# Patient Record
Sex: Female | Born: 1935 | Race: White | Hispanic: No | Smoking: Never smoker
Health system: Southern US, Community
[De-identification: ages and names within clinical notes are randomized; demographics above are authoritative.]

## PROBLEM LIST (undated history)

## (undated) DIAGNOSIS — C801 Malignant (primary) neoplasm, unspecified: Secondary | ICD-10-CM

## (undated) DIAGNOSIS — E785 Hyperlipidemia, unspecified: Secondary | ICD-10-CM

## (undated) DIAGNOSIS — K649 Unspecified hemorrhoids: Secondary | ICD-10-CM

## (undated) DIAGNOSIS — K219 Gastro-esophageal reflux disease without esophagitis: Secondary | ICD-10-CM

## (undated) DIAGNOSIS — K8689 Other specified diseases of pancreas: Secondary | ICD-10-CM

## (undated) HISTORY — DX: Hyperlipidemia, unspecified: E78.5

## (undated) HISTORY — DX: Gastro-esophageal reflux disease without esophagitis: K21.9

## (undated) HISTORY — PX: PORTACATH PLACEMENT: SHX2246

## (undated) HISTORY — DX: Unspecified hemorrhoids: K64.9

---

## 1988-11-25 HISTORY — PX: ABDOMINAL HYSTERECTOMY: SHX81

## 2000-10-23 ENCOUNTER — Ambulatory Visit (HOSPITAL_COMMUNITY): Admission: RE | Admit: 2000-10-23 | Discharge: 2000-10-23 | Payer: Self-pay | Admitting: Orthopedic Surgery

## 2000-10-23 ENCOUNTER — Encounter: Payer: Self-pay | Admitting: Orthopedic Surgery

## 2001-05-12 ENCOUNTER — Encounter (INDEPENDENT_AMBULATORY_CARE_PROVIDER_SITE_OTHER): Payer: Self-pay | Admitting: *Deleted

## 2001-05-12 ENCOUNTER — Ambulatory Visit (HOSPITAL_BASED_OUTPATIENT_CLINIC_OR_DEPARTMENT_OTHER): Admission: RE | Admit: 2001-05-12 | Discharge: 2001-05-12 | Payer: Self-pay | Admitting: Orthopedic Surgery

## 2004-11-25 HISTORY — PX: COLONOSCOPY: SHX5424

## 2005-03-26 ENCOUNTER — Ambulatory Visit: Payer: Self-pay | Admitting: Gastroenterology

## 2005-04-08 ENCOUNTER — Ambulatory Visit: Payer: Self-pay | Admitting: Gastroenterology

## 2007-04-08 ENCOUNTER — Encounter: Admission: RE | Admit: 2007-04-08 | Discharge: 2007-04-24 | Payer: Self-pay | Admitting: Sports Medicine

## 2011-04-12 NOTE — Op Note (Signed)
[facility]  Patient:    Crystal Small, Crystal Small                          MRN: [id number] Proc. Date: 05/12/01 Adm. Date:  [id number] Attending:  Ronne Binning                           Operative Report  PREOPERATIVE DIAGNOSES:  Arthritis left elbow, carpal tunnel syndrome left hand.  POSTOPERATIVE DIAGNOSES:  Arthritis left elbow, carpal tunnel syndrome left hand.  OPERATION:  Arthroscopic inspection, debridement, removal of loose bodies left elbow.  Left tunnel release.  SURGEON:  Nicki Reaper, M.D.  ASSISTANT:  RN  ANESTHESIA:  General.  ANESTHESIOLOGIST:  Guadalupe Maple, M.D.  HISTORY:  The patient is a 76 year old female with a history of arthritis, carpal tunnel syndrome both unresponsive to conservative treatment.  PROCEDURE:  The patient is brought to the operating room where a general anesthetic was carried out without difficulty.  She was prepped and draped using Betadine scrub and a solution in a lateral decubitus position, left arm up.  The prep was done with ______.  The arthroscopy was performed first. The joint plated to the posterior portal.  The medial portal was made after isolation of the ulnar nerve and the incision was made approximately 2.5-3 cm proximal to the medial epicondyle.  The medial intermuscular septum identified to the anterior aspect.  Blunt trocar was used to enter the joint after inflation.  The joint was inspected.  Significant arthritic changes were present about the entire joint both medial and lateral compartments.  A large, loose body was identified, large exostosis in the ______ coronoid fossa was also identified.  The lateral portal was then opened using switching stick by placement of the scope between the radial head and distal humerus.  This was then passed from an inside-out technique.  Incision made.  A second cannula was then entered from the lateral aspect.  A debridement was then  performed using ______ shaver and burrs.  The exostosis ______ was removed anteriorly.  The loose body was morcellized and removed from both medial and lateral aspects.  Scope was then introduced medially or laterally as necessary using switching stick.  A posterior aspect was then inspected.  No loose bodies were present posteriorly.  There were significant degenerative changes present on the radial head with entire eburnation of bone, radial head, and posterior capitellum and decided not to proceed with a radial head resection at this point in time.  The portals were then closed after removal of the scope and irrigation.  The patient reprepped and draped in a supine position.  A tourniquet placed high in the arm was inflated to 250 mmHG.  The straight incision was made longitudinally for carpal tunnel release, carried down through subcutaneous tissue.  Bleeders were electrocauterized.  Palmar fascia was split.  Superficial palmar arch identified.  The flexor tendon to the ring and little finger identified, to the ulnar side of the median nerve.  The carpal retinaculum was incised with sharp dissection.  A right angle Sewell retractor placed between skin and forearm fascia.  The fascia was released with approximately 3 cm proximal to the wrist crease under direct vision. Canal was explored.  No further lesions were identified.  The wound was irrigated.  The skin was closed with interrupted 5-0 nylon sutures.  Sterile compressive dressing and splint  was applied to the hand.  Compressive dressing applied to the elbow.  Patient tolerated the procedure well and was taken to the recovery room for observation in satisfactory condition.  She is discharged home to return to the [facility] in one week on Vicodin and Keflex. DD:  05/12/01 TD:  05/12/01 Job: [id number]

## 2015-05-11 ENCOUNTER — Encounter: Payer: Self-pay | Admitting: Internal Medicine

## 2015-06-30 ENCOUNTER — Encounter: Payer: Self-pay | Admitting: *Deleted

## 2015-07-18 ENCOUNTER — Encounter: Payer: Self-pay | Admitting: Internal Medicine

## 2015-07-18 ENCOUNTER — Ambulatory Visit (INDEPENDENT_AMBULATORY_CARE_PROVIDER_SITE_OTHER): Payer: Medicare Other | Admitting: Internal Medicine

## 2015-07-18 VITALS — BP 120/60 | HR 72 | Ht 64.57 in | Wt 128.4 lb

## 2015-07-18 DIAGNOSIS — K219 Gastro-esophageal reflux disease without esophagitis: Secondary | ICD-10-CM | POA: Diagnosis not present

## 2015-07-18 DIAGNOSIS — Z1211 Encounter for screening for malignant neoplasm of colon: Secondary | ICD-10-CM

## 2015-07-18 NOTE — Progress Notes (Signed)
Patient ID: Crystal Small, female   DOB: [birth year], 80 y.o.   MRN: [id number] HPI: Crystal Small is a 80 year old female with past medical history of GERD, hyperlipidemia and arthritis who seen to consider repeat screening colonoscopy. She is seen by Dr. Osborne Casco at [facility].  She is here alone today. She reports she is feeling well. She has been having regular bowel movements though on occasion can have mild constipation. She adds flaxseeds to her cereal and trys to eat almonds every day for fiber. She denies abdominal pain. Occasionally she'll fill left-sided abdominal discomfort which comes and goes. Last less than a day and doesn't seem to relate to eating or bowel movement. Appetite has been good. She denies dysphagia or odynophagia. No nausea or vomiting. Denies blood in her stool or melena. She takes omeprazole 40 mg daily and uses over-the-counter Zantac very rarely for breakthrough heartburn at night. She tries to follow a GERD diet and avoids eating late at night and also tries to remain upright for an hour after eating. She denies prior history of colon polyps. Denies family history of colon cancer. She last had a colonoscopy on 04/08/2005 by Dr. Sharlett Iles. This was normal.  Past Medical History  Diagnosis Date  . Hyperlipidemia   . Hemorrhoids     Past Surgical History  Procedure Laterality Date  . Colonoscopy  2006  . Abdominal hysterectomy  1990    No outpatient prescriptions prior to visit.   No facility-administered medications prior to visit.    No Known Allergies  Family History  Problem Relation Age of Onset  . Heart disease Sister   . Heart disease Brother   . Lung cancer Brother     Social History  Substance Use Topics  . Smoking status: Never Smoker   . Smokeless tobacco: Never Used  . Alcohol Use: No    ROS: As per history of present illness, otherwise negative  BP 120/60 mmHg  Pulse 72  Ht 5' 4.57" (1.64 m)  Wt 128 lb 6 oz (58.231 kg)  BMI 21.65  kg/m2 Constitutional: Well-developed and well-nourished. No distress. HEENT: Normocephalic and atraumatic. Oropharynx is clear and moist. No oropharyngeal exudate. Conjunctivae are normal.  No scleral icterus. Neck: Neck supple. Trachea midline. Cardiovascular: Normal rate, regular rhythm and intact distal pulses. No M/R/G Pulmonary/chest: Effort normal and breath sounds normal. No wheezing, rales or rhonchi. Abdominal: Soft, nontender, nondistended. Bowel sounds active throughout. There are no masses palpable. No hepatosplenomegaly. Extremities: no clubbing, cyanosis, or edema Lymphadenopathy: No cervical adenopathy noted. Neurological: Alert and oriented to person place and time. Skin: Skin is warm and dry. No rashes noted. Psychiatric: Normal mood and affect. Behavior is normal.   ASSESSMENT/PLAN:  80 year old female with past medical history of GERD, hyperlipidemia and arthritis who seen to consider repeat screening colonoscopy.   1. CRC screening -- average risk, no prior history of polyps. We discussed colonoscopy including the risks, benefits and alternatives. We also discussed Cologuard. She would prefer Cologuard and understands that if positive she will need colonoscopy and she would be agreeable if necessary. This test will be ordered today. Given her age, if Cologuard negative she will likely not require further colon cancer screening  2. GERD -- stable without alarm symptoms. Continue omeprazole 40 mg daily and when necessary ranitidine for breakthrough.    ZC:[address], [address] [place], [place] [address]

## 2015-07-18 NOTE — Patient Instructions (Signed)
We have sent your demographic and insurance information to [facility]. They should contact you within the next week regarding your Cologuard (colon cancer screening) test. If you have not heard from them within the next week, please call our office at [phone]. 

## 2015-07-24 LAB — COLOGUARD: Cologuard: NEGATIVE

## 2015-08-04 ENCOUNTER — Telehealth: Payer: Self-pay | Admitting: *Deleted

## 2015-08-04 NOTE — Telephone Encounter (Signed)
Dr Hilarie Fredrickson has received patient's cologuard results and indicates the following : "great news. Please inform patient. Will NOT require repeat screening given age of 30 years." I have attempted to reach the patient to advise her but I have been unable to reach her as her phone was busy x 2. I will attempt to call her back at a later time.

## 2015-08-07 NOTE — Telephone Encounter (Signed)
I have spoken to patient to advise of Dr Vena Rua interpretation of cologuard testing. Patient verbalizes understanding.

## 2015-08-10 ENCOUNTER — Encounter: Payer: Self-pay | Admitting: Internal Medicine

## 2016-05-17 ENCOUNTER — Encounter: Payer: Self-pay | Admitting: *Deleted

## 2016-06-10 ENCOUNTER — Ambulatory Visit (INDEPENDENT_AMBULATORY_CARE_PROVIDER_SITE_OTHER): Payer: Medicare Other | Admitting: Internal Medicine

## 2016-06-10 ENCOUNTER — Encounter: Payer: Self-pay | Admitting: Internal Medicine

## 2016-06-10 VITALS — BP 112/70 | HR 64 | Ht 64.0 in | Wt 114.6 lb

## 2016-06-10 DIAGNOSIS — R109 Unspecified abdominal pain: Secondary | ICD-10-CM | POA: Diagnosis not present

## 2016-06-10 DIAGNOSIS — R634 Abnormal weight loss: Secondary | ICD-10-CM

## 2016-06-10 MED ORDER — HYOSCYAMINE SULFATE 0.125 MG SL SUBL: SUBLINGUAL_TABLET | SUBLINGUAL | Status: DC

## 2016-06-10 NOTE — Progress Notes (Signed)
Subjective:    Patient ID: Crystal Small, female    DOB: 21-Nov-1935, 81 y.o.   MRN: [id number]  HPI Crystal Small is a 81 year old female with a past medical history of GERD, hyperlipidemia and arthritis who is here to discuss new onset left lower quadrant abdominal pain and weight loss. She was seen here in August 2016 to discuss screening/surveillance colonoscopy. The decision was made to pursue Cologuard testing which was performed and negative. She is here today with her daughter  She reports over the last several months she's developed a soreness and at times pain in her left lower quadrant. This feels like a swelling and can at times become severe. It seems to be worse with eating though she is unaware of specific dietary trigger. She has been avoiding lactose and this is not changed the pain but has improved her reflux symptoms. Pain at times seems to be worse with lying down. She reports getting up and walking around to try to help the pain. Because of the associated worsening with eating she has avoided eating full meals. At times she feels full quickly. She denies nausea and vomiting. She has lost 14 pounds since her last visit here 11 months ago. She feels like she has lost 8 pounds from December 16 of June 17 and 4 pounds June. She reports her bowel movements is regular at times they are small and other times they're larger and more normal size. She states they are formed without blood or melena. Denies constipation and diarrhea. For her reflux she's been using omeprazole 40 mg daily and very rarely over-the-counter Zantac.  She had labs performed very recently by primary care which she has a copy of have reviewed. CMP within normal limits. Total bilirubin 0.7, AST 19, ALT 15, albumin 3.8. CBC: WBC 7.0, hemoglobin 13.3, MCV 100.3, platelet 211 Vitamin D normal  Review of Systems As per history of present illness, otherwise negative  Current Medications, Allergies, Past Medical History,  Past Surgical History, Family History and Social History were reviewed in [employer] record.     Objective:   Physical Exam BP 112/70 mmHg  Pulse 64  Ht 5\' 4"  (1.626 m)  Wt 114 lb 9.6 oz (51.982 kg)  BMI 19.66 kg/m2 Constitutional: Well-developed and well-nourished. No distress. HEENT: Normocephalic and atraumatic. Oropharynx is clear and moist. No oropharyngeal exudate. Conjunctivae are normal.  No scleral icterus. Neck: Neck supple. Trachea midline. Cardiovascular: Normal rate, regular rhythm and intact distal pulses. No M/R/G Pulmonary/chest: Effort normal and breath sounds normal. No wheezing, rales or rhonchi. Abdominal: Soft, Moderate tenderness in the left mid and lower quadrant and suprapubic abdomen without rebound or guarding, nondistended. Bowel sounds active throughout. No HSM Extremities: no clubbing, cyanosis, or edema Lymphadenopathy: No cervical adenopathy noted. Neurological: Alert and oriented to person place and time. Skin: Skin is warm and dry. No rashes noted. Psychiatric: Normal mood and affect. Behavior is normal.  Last colonoscopy 04/08/2005 -- normal. Preparation excellent.     Assessment & Plan:  81 year old female with a past medical history of GERD, hyperlipidemia and arthritis who is here to discuss new onset left lower quadrant abdominal pain and weight loss.   1. LLQ pain/weight loss -- Her left lower quadrant pain and weight loss is concerning though she does not seem to have any change in bowel habit. Her labs performed on 05/14/2016 were reviewed and normal, which is reassuring. Cologuard recently negative, also reassuring. I recommended CT scan of the abdomen  and pelvis with IV contrast to further evaluate this pain, rule out diverticulitis and colitis. If negative consider upper endoscopy and colonoscopy versus flexible sigmoidoscopy. If GI evaluation unremarkable would recommend evaluating the thoracic and lumbar spine which  could cause referred pain given her history of arthritis. Will give Levsin 0.125 mg sublingual every 4-6 hours for abdominal pain while awaiting workup.  25 minutes spent with the patient today. Greater than 50% was spent in counseling and coordination of care with the patient

## 2016-06-10 NOTE — Patient Instructions (Signed)
We have sent the following medications to your pharmacy for you to pick up at your convenience:Levsin.  You have been scheduled for a CT scan of the abdomen and pelvis at [facility] ([address]---this is in the same building as [facility]).   You are scheduled on 06/14/16 at 10:30am. You should arrive 15 minutes prior to your appointment time for registration. Please follow the written instructions below on the day of your exam:  WARNING: IF YOU ARE ALLERGIC TO IODINE/X-RAY DYE, PLEASE NOTIFY RADIOLOGY IMMEDIATELY AT [phone]! YOU WILL BE GIVEN A 13 HOUR PREMEDICATION PREP.  1) Do not eat or drink anything after 6:30am (4 hours prior to your test) 2) You have been given 2 bottles of oral contrast to drink. The solution may taste better if refrigerated, but do NOT add ice or any other liquid to this solution. Shake well before drinking.    Drink 1 bottle of contrast @ 8:30am (2 hours prior to your exam)  Drink 1 bottle of contrast @ 9:30am (1 hour prior to your exam)  You may take any medications as prescribed with a small amount of water except for the following: Metformin, Glucophage, Glucovance, Avandamet, Riomet, Fortamet, Actoplus Met, Janumet, Glumetza or Metaglip. The above medications must be held the day of the exam AND 48 hours after the exam.  The purpose of you drinking the oral contrast is to aid in the visualization of your intestinal tract. The contrast solution may cause some diarrhea. Before your exam is started, you will be given a small amount of fluid to drink. Depending on your individual set of symptoms, you may also receive an intravenous injection of x-ray contrast/dye. Plan on being at [facility] for 30 minutes or longer, depending on the type of exam you are having performed.  This test typically takes 30-45 minutes to complete.  If you have any questions regarding your exam or if you need to reschedule, you may call the CT department  at [phone] between the hours of 8:00 am and 5:00 pm, Monday-Friday.  ________________________________________________________________________

## 2016-06-14 ENCOUNTER — Telehealth: Payer: Self-pay | Admitting: *Deleted

## 2016-06-14 ENCOUNTER — Ambulatory Visit (INDEPENDENT_AMBULATORY_CARE_PROVIDER_SITE_OTHER)
Admission: RE | Admit: 2016-06-14 | Discharge: 2016-06-14 | Disposition: A | Discharge status: Home / Self Care | Payer: Medicare Other | Source: Ambulatory Visit | Attending: Internal Medicine | Admitting: Internal Medicine

## 2016-06-14 DIAGNOSIS — R109 Unspecified abdominal pain: Secondary | ICD-10-CM

## 2016-06-14 DIAGNOSIS — R634 Abnormal weight loss: Secondary | ICD-10-CM | POA: Diagnosis not present

## 2016-06-14 MED ORDER — IOPAMIDOL (ISOVUE-300) INJECTION 61%
100.0000 mL | Freq: Once | INTRAVENOUS | Status: AC | PRN
  Administered 2016-06-14: 80 mL via INTRAVENOUS

## 2016-06-14 NOTE — Telephone Encounter (Signed)
Call report received from [place] in regards to CT. 5.7 cm mass in the pancreatic body and tail, highly suspicious for pancreatic adenocarcinoma. 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 No evidence of involvement of the celiac axis or SMA.  I have spoken to Dr Hilarie Fredrickson via phone to advise of results. He is comfortable with waiting until Monday to go over results with patient since not much can be done over the weekend. Splenic vein thrombosis unlikely to be treated with anticoagulation in the setting of probable adenocarcinoma. Dr Hilarie Fredrickson has requested that Dr Ardis Hughs look over case for possible EUS and note will be routed to him. Dr Havery Moros is also advised of results (doc of day) in the case that patient calls today for results.

## 2016-06-14 NOTE — Telephone Encounter (Signed)
All, I looked at CT.  Pretty concerning for metastatic pancreatic cancer, tail of pancreas mass and multiple suspicious masses in the liver.  I recommend US guided biopsy of one of the liver lesions, IR should be able to get one of the peripheral lesions pretty easily. That will hopefully prove the diagnosis and up-stage appropriately.  Would begin referral process to medical oncology as well.

## 2016-06-17 ENCOUNTER — Telehealth: Payer: Self-pay | Admitting: *Deleted

## 2016-06-17 ENCOUNTER — Telehealth: Payer: Self-pay | Admitting: Internal Medicine

## 2016-06-17 DIAGNOSIS — R935 Abnormal findings on diagnostic imaging of other abdominal regions, including retroperitoneum: Secondary | ICD-10-CM

## 2016-06-17 MED ORDER — HYDROCODONE-ACETAMINOPHEN 5-325 MG PO TABS: ORAL_TABLET | ORAL | 0 refills | Status: DC

## 2016-06-17 NOTE — Telephone Encounter (Signed)
See result note Pt and daughter notified Arranging IR guided bx See result note

## 2016-06-17 NOTE — Addendum Note (Signed)
Addended by: Larina Bras on: 06/17/2016 03:08 PM   Modules accepted: Orders

## 2016-06-17 NOTE — Telephone Encounter (Signed)
error 

## 2016-06-17 NOTE — Telephone Encounter (Signed)
Liver biopsy has been ordered STAT and is awaiting review by radiologist prior to scheduling. Dr Hilarie Fredrickson has also okayed patient to have hydrocodone 5/325 mg-1 tablet by mouth every 6-8 hours as needed for abdominal pain #60. Med Oncology referral placed and Merceda Elks, oncology navigator was sent staff message as well.

## 2016-06-17 NOTE — Telephone Encounter (Signed)
-----   Message from Jerene Bears, MD sent at 06/17/2016 10:25 AM EDT ----- I spoke with the patient and her daughter, Suanne Marker, today by phone to discuss CT abdomen and pelvis results Pancreatic tumor concerning for malignancy with spread to the liver We discussed this at length. Dr. Ardis Hughs has reviewed, both he and I agree that IR guided liver biopsy is a most appropriate next step. Please arrange IR guided liver biopsy of liver lesions, rule out pancreatic adenocarcinoma. This should be ordered ASAP Oncology referral can be arranged with Dr. Benay Spice or Dr. Burr Medico Time provided for questions and answers. They both thanked me for the call and will call with questions or concerns going forward. I will cc her primary care provider

## 2016-06-18 ENCOUNTER — Other Ambulatory Visit: Payer: Self-pay | Admitting: General Surgery

## 2016-06-18 ENCOUNTER — Other Ambulatory Visit: Payer: Self-pay | Admitting: Physician Assistant

## 2016-06-19 ENCOUNTER — Encounter (HOSPITAL_COMMUNITY): Payer: Self-pay

## 2016-06-19 ENCOUNTER — Other Ambulatory Visit: Payer: Self-pay | Admitting: Internal Medicine

## 2016-06-19 ENCOUNTER — Ambulatory Visit (HOSPITAL_COMMUNITY)
Admission: RE | Admit: 2016-06-19 | Discharge: 2016-06-19 | Disposition: A | Discharge status: Home / Self Care | Payer: Medicare Other | Source: Ambulatory Visit | Attending: Internal Medicine | Admitting: Internal Medicine

## 2016-06-19 ENCOUNTER — Other Ambulatory Visit: Payer: Self-pay | Admitting: General Surgery

## 2016-06-19 DIAGNOSIS — R935 Abnormal findings on diagnostic imaging of other abdominal regions, including retroperitoneum: Secondary | ICD-10-CM

## 2016-06-19 HISTORY — PX: LIVER BIOPSY: SHX301

## 2016-06-19 LAB — CBC
HCT: 34.7 % — ABNORMAL LOW (ref 36.0–46.0)
HEMOGLOBIN: 11.2 g/dL — AB (ref 12.0–15.0)
MCH: 30.2 pg (ref 26.0–34.0)
MCHC: 32.3 g/dL (ref 30.0–36.0)
MCV: 93.5 fL (ref 78.0–100.0)
PLATELETS: 222 10*3/uL (ref 150–400)
RBC: 3.71 MIL/uL — ABNORMAL LOW (ref 3.87–5.11)
RDW: 12.8 % (ref 11.5–15.5)
WBC: 9.2 10*3/uL (ref 4.0–10.5)

## 2016-06-19 LAB — PROTIME-INR
INR: 1.09
PROTHROMBIN TIME: 14.1 s (ref 11.4–15.2)

## 2016-06-19 LAB — APTT: aPTT: 32 seconds (ref 24–36)

## 2016-06-19 MED ORDER — FENTANYL CITRATE (PF) 100 MCG/2ML IJ SOLN
INTRAMUSCULAR | Status: AC
  Filled 2016-06-19: qty 2

## 2016-06-19 MED ORDER — SODIUM CHLORIDE 0.9 % IV SOLN: INTRAVENOUS | Status: DC

## 2016-06-19 MED ORDER — GELATIN ABSORBABLE 12-7 MM EX MISC
CUTANEOUS | Status: AC
  Filled 2016-06-19: qty 1

## 2016-06-19 MED ORDER — SODIUM CHLORIDE 0.9 % IV SOLN
INTRAVENOUS | Status: AC | PRN
  Administered 2016-06-19: 10 mL/h via INTRAVENOUS

## 2016-06-19 MED ORDER — MIDAZOLAM HCL 2 MG/2ML IJ SOLN
INTRAMUSCULAR | Status: AC | PRN
  Administered 2016-06-19: 1 mg via INTRAVENOUS

## 2016-06-19 MED ORDER — FENTANYL CITRATE (PF) 100 MCG/2ML IJ SOLN
INTRAMUSCULAR | Status: AC | PRN
  Administered 2016-06-19: 25 ug via INTRAVENOUS

## 2016-06-19 MED ORDER — MIDAZOLAM HCL 2 MG/2ML IJ SOLN
INTRAMUSCULAR | Status: AC
  Filled 2016-06-19: qty 2

## 2016-06-19 MED ORDER — LIDOCAINE HCL (PF) 1 % IJ SOLN
INTRAMUSCULAR | Status: AC
  Filled 2016-06-19: qty 10

## 2016-06-19 NOTE — H&P (Signed)
Chief Complaint: Abdominal pain  Referring Physician(s): Pyrtle,Jay M  Supervising Physician: Markus Daft  Patient Status: Outpatient  History of Present Illness:  Crystal Small is a 81 y.o. female who reports over the last several months she's developed a soreness and at times pain in her left lower quadrant.    She states it feels like a swelling and can at times become severe. It seems to be worse with eating though she is unaware of specific dietary trigger.   Pain at times seems to be worse with lying down. She reports getting up and walking around to try to help the pain.   Because of the associated worsening with eating she has avoided eating full meals. At times she feels full quickly.   She denies nausea and vomiting. She has lost 14 pounds since her last visit here 11 months ago.  She was seen by Dr. Hilarie Fredrickson for evaluation and CT scan was obtained.  CT revealed a 5.7 cm mass in the pancreatic body and tail, highly suspicious for pancreatic adenocarcinoma with multiple liver mets.  We are asked to perform an US guided biopsy of one of the liver lesions today.  She feels ok today, no recent illness, fever or chills.   She is NPO. She does not take blood thinners.  Past Medical History:  Diagnosis Date  . GERD (gastroesophageal reflux disease)   . Hemorrhoids   . Hyperlipidemia     Past Surgical History:  Procedure Laterality Date  . ABDOMINAL HYSTERECTOMY  1990  . COLONOSCOPY  2006    Allergies: Review of patient's allergies indicates no known allergies.  Medications: Prior to Admission medications   Medication Sig Start Date End Date Taking? Authorizing Provider  aspirin 325 MG tablet Take 325 mg by mouth every 6 (six) hours as needed for mild pain.   Yes Historical Provider, MD  cholecalciferol (VITAMIN D) 1000 UNITS tablet Take 1,000 Units by mouth 3 (three) times a week.   Yes Historical Provider, MD  HYDROcodone-acetaminophen (NORCO/VICODIN) 5-325  MG tablet Take 1 tablet by mouth every 6-8 hours as needed for abdominal pain 06/17/16  Yes Amy S Esterwood, PA-C  hyoscyamine (LEVSIN SL) 0.125 MG SL tablet Take 1-2 capsules by mouth every 4-6 hours as needed for abd pain 06/10/16  Yes Jerene Bears, MD  Multiple Vitamins-Minerals (MULTIVITAMIN ADULT PO) Take 1 tablet by mouth daily.    Yes Historical Provider, MD  omeprazole (PRILOSEC) 40 MG capsule Take 40 mg by mouth daily.  04/25/15  Yes Historical Provider, MD  pravastatin (PRAVACHOL) 20 MG tablet Take 40 mg by mouth daily.  05/25/15  Yes Historical Provider, MD     Family History  Problem Relation Age of Onset  . Heart disease Sister   . Heart disease Brother   . Lung cancer Brother     Social History   Social History  . Marital status: Widowed    Spouse name: N/A  . Number of children: 2  . Years of education: N/A   Occupational History  . Retired    Social History Main Topics  . Smoking status: Never Smoker  . Smokeless tobacco: Never Used  . Alcohol use No  . Drug use: No  . Sexual activity: Not Asked   Other Topics Concern  . None   Social History Narrative  . None     Review of Systems: A 12 point ROS discussed  Review of Systems  Constitutional: Positive for appetite change and  unexpected weight change. Negative for activity change, chills, fatigue and fever.  HENT: Negative.   Respiratory: Negative for cough and shortness of breath.   Cardiovascular: Negative for chest pain.  Gastrointestinal: Positive for abdominal pain. Negative for nausea and vomiting.       Early satiety   Genitourinary: Negative.   Musculoskeletal: Negative.   Skin: Negative.   Neurological: Negative.   Hematological: Negative.   Psychiatric/Behavioral: Negative.     Vital Signs: BP 134/62   Pulse 85   Temp 98.6 F (37 C)   Ht 5\' 6"  (1.676 m)   Wt 114 lb (51.7 kg)   SpO2 97%   BMI 18.40 kg/m   Physical Exam  Constitutional: She is oriented to person, place, and time.  She appears well-developed and well-nourished.  HENT:  Head: Normocephalic and atraumatic.  Eyes: EOM are normal.  Neck: Normal range of motion.  Cardiovascular: Normal rate, regular rhythm and normal heart sounds.   Pulmonary/Chest: Effort normal and breath sounds normal. She has no wheezes.  Abdominal: Soft. Bowel sounds are normal. She exhibits no distension. There is no tenderness.  Musculoskeletal: Normal range of motion.  Neurological: She is alert and oriented to person, place, and time.  Skin: Skin is warm and dry.  Psychiatric: She has a normal mood and affect. Her behavior is normal. Judgment and thought content normal.  Vitals reviewed.   Mallampati Score:  MD Evaluation Airway: WNL Heart: WNL Abdomen: WNL Chest/ Lungs: WNL ASA  Classification: 3 Mallampati/Airway Score: Two  Imaging: Ct Abdomen Pelvis W Contrast  Result Date: 06/14/2016 CLINICAL DATA:  Worsening left-sided abdominal pain and unintentional 12 pounds weight loss. EXAM: CT ABDOMEN AND PELVIS WITH CONTRAST TECHNIQUE: Multidetector CT imaging of the abdomen and pelvis was performed using the standard protocol following bolus administration of intravenous contrast. CONTRAST:  39mL ISOVUE-300 IOPAMIDOL (ISOVUE-300) INJECTION 61% COMPARISON:  None. FINDINGS: Lower chest:  No acute findings. Hepatobiliary: Several hypodense masses with peripheral enhancement are seen in both the right and left hepatic lobes, largest in segment 7 of the posterior right hepatic lobe measuring approximately 3.8 x 3.0 cm. These are consistent with hepatic metastases. Gallbladder is unremarkable. No evidence of biliary ductal dilatation. Pancreas: A poorly defined hypodense mass is seen in the pancreatic body and tail measuring 4.5 x 65.7 cm on image 17/series 2. This is suspicious for primary pancreatic adenocarcinoma. This mass abuts and narrows the superior mesenteric vein along its anterior and left lateral walls on images 18 and 19/2,  and also causes splenic vein thrombosis. There is no evidence of involvement of the celiac axis or SMA. Spleen: Within normal limits in size and appearance. Adrenals/Urinary Tract: No masses identified. No evidence of hydronephrosis. Stomach/Bowel: No evidence of obstruction, inflammatory process, or abnormal fluid collections. Vascular/Lymphatic: Small less than 1 cm peripancreatic lymph nodes are seen, largest in the gastrohepatic ligament measuring 8 mm on image 12/2. Metastatic disease cannot be excluded. Aortic atherosclerosis noted. No evidence of abdominal aortic aneurysm. Reproductive: Prior hysterectomy noted. Adnexal regions are unremarkable in appearance. Pelvic floor laxity with cystocele formation noted. Other: None. Musculoskeletal: No suspicious bone lesions identified. Advanced lumbar spine degenerative changes are noted with old compression fracture deformity of the T12 vertebral body. IMPRESSION: 5.7 cm mass in the pancreatic body and tail, highly suspicious for pancreatic adenocarcinoma. 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 No evidence of involvement of the celiac axis or SMA. Tiny less than 1 cm peripancreatic  lymph nodes. Lymph node metastases cannot be excluded. Multiple liver metastases. Aortic atherosclerosis noted. These results will be called to the ordering clinician or representative by the Radiologist Assistant, and communication documented in the PACS or zVision Dashboard. Electronically Signed   By: Earle Gell M.D.   On: 06/14/2016 13:48    Labs:  CBC: No results for input(s): WBC, HGB, HCT, PLT in the last 8760 hours.  COAGS: No results for input(s): INR, APTT in the last 8760 hours.  BMP: No results for input(s): NA, K, CL, CO2, GLUCOSE, BUN, CALCIUM, CREATININE, GFRNONAA, GFRAA in the last 8760 hours.  Invalid input(s): CMP  LIVER FUNCTION TESTS: No results for input(s): BILITOT, AST, ALT, ALKPHOS, PROT,  ALBUMIN in the last 8760 hours.  TUMOR MARKERS: No results for input(s): AFPTM, CEA, CA199, CHROMGRNA in the last 8760 hours.  Assessment and Plan:  5.7 cm mass in the pancreatic body and tail, highly suspicious for pancreatic adenocarcinoma with multiple liver mets.  Will proceed with image guided biopsy of a liver mass today by Dr. Anselm Pancoast.  Risks and Benefits discussed with the patient including, but not limited to bleeding, infection, damage to adjacent structures or low yield requiring additional tests.  All of the patient's questions were answered, patient is agreeable to proceed. Consent signed and in chart.  Thank you for this interesting consult.  I greatly enjoyed meeting Crystal Small and look forward to participating in their care.  A copy of this report was sent to the requesting provider on this date.  Electronically Signed: Murrell Redden PA-C 06/19/2016, 12:24 PM   I spent a total of  30 Minutes in face to face in clinical consultation, greater than 50% of which was counseling/coordinating care for biopsy of liver.

## 2016-06-19 NOTE — Progress Notes (Signed)
Received from radiology via stretcher.  A&O x 4.  Pt did not have biopsy due to difficult visualization of site.  Will monitor until discharge.

## 2016-06-19 NOTE — Procedures (Signed)
Planned for US guided liver lesion biopsy.  Thought we were able to visualize a lesion in right hepatic lobe with [place].  Patient given moderate sedation.  Patient was prepped and draped in sterile fashion.  Unable to confidentially identify the lesion after patient was sedated.  Therefore, biopsy was not performed.

## 2016-06-20 ENCOUNTER — Encounter (HOSPITAL_COMMUNITY): Payer: Self-pay | Admitting: *Deleted

## 2016-06-20 ENCOUNTER — Other Ambulatory Visit: Payer: Self-pay

## 2016-06-20 DIAGNOSIS — K8689 Other specified diseases of pancreas: Secondary | ICD-10-CM

## 2016-06-21 ENCOUNTER — Telehealth: Payer: Self-pay | Admitting: *Deleted

## 2016-06-21 ENCOUNTER — Encounter (HOSPITAL_COMMUNITY): Payer: Self-pay | Admitting: *Deleted

## 2016-06-21 NOTE — Telephone Encounter (Signed)
Oncology Nurse Navigator Documentation  Oncology Nurse Navigator Flowsheets 06/21/2016  Navigator Location [facility]  Navigator Encounter Type Telephone  Telephone Outgoing Call  Abnormal Finding Date 06/14/2016  Spoke with patient and provided new patient appointment for 07/02/16 at 2 pm to see Dr. Benay Spice. Informed of location of [facility], valet service, and registration process. Reminded to bring insurance cards and a current medication list, including supplements. Patient verbalizes understanding.

## 2016-06-25 ENCOUNTER — Ambulatory Visit: Payer: Medicare Other | Admitting: Oncology

## 2016-06-27 ENCOUNTER — Ambulatory Visit (HOSPITAL_COMMUNITY): Payer: Medicare Other | Admitting: Registered Nurse

## 2016-06-27 ENCOUNTER — Encounter (HOSPITAL_COMMUNITY): Payer: Self-pay | Admitting: Registered Nurse

## 2016-06-27 ENCOUNTER — Ambulatory Visit (HOSPITAL_COMMUNITY)
Admission: RE | Admit: 2016-06-27 | Discharge: 2016-06-27 | Disposition: A | Discharge status: Home / Self Care | Payer: Medicare Other | Source: Ambulatory Visit | Attending: Gastroenterology | Admitting: Gastroenterology

## 2016-06-27 ENCOUNTER — Encounter (HOSPITAL_COMMUNITY)
Admission: RE | Disposition: A | Discharge status: Home / Self Care | Payer: Self-pay | Source: Ambulatory Visit | Attending: Gastroenterology

## 2016-06-27 DIAGNOSIS — K219 Gastro-esophageal reflux disease without esophagitis: Secondary | ICD-10-CM | POA: Diagnosis not present

## 2016-06-27 DIAGNOSIS — R932 Abnormal findings on diagnostic imaging of liver and biliary tract: Secondary | ICD-10-CM | POA: Diagnosis not present

## 2016-06-27 DIAGNOSIS — C220 Liver cell carcinoma: Secondary | ICD-10-CM | POA: Insufficient documentation

## 2016-06-27 DIAGNOSIS — M1990 Unspecified osteoarthritis, unspecified site: Secondary | ICD-10-CM | POA: Diagnosis not present

## 2016-06-27 DIAGNOSIS — R1032 Left lower quadrant pain: Secondary | ICD-10-CM | POA: Diagnosis present

## 2016-06-27 DIAGNOSIS — R933 Abnormal findings on diagnostic imaging of other parts of digestive tract: Secondary | ICD-10-CM

## 2016-06-27 DIAGNOSIS — K8689 Other specified diseases of pancreas: Secondary | ICD-10-CM

## 2016-06-27 HISTORY — PX: EUS: SHX5427

## 2016-06-27 HISTORY — DX: Other specified diseases of pancreas: K86.89

## 2016-06-27 SURGERY — ESOPHAGEAL ENDOSCOPIC ULTRASOUND (EUS) RADIAL
Anesthesia: Monitor Anesthesia Care

## 2016-06-27 MED ORDER — LIDOCAINE HCL (CARDIAC) 20 MG/ML IV SOLN
INTRAVENOUS | Status: AC
  Filled 2016-06-27: qty 5

## 2016-06-27 MED ORDER — LIDOCAINE HCL (CARDIAC) 20 MG/ML IV SOLN
INTRAVENOUS | Status: DC | PRN
  Administered 2016-06-27: 50 mg via INTRAVENOUS

## 2016-06-27 MED ORDER — SODIUM CHLORIDE 0.9 % IV SOLN: INTRAVENOUS | Status: DC

## 2016-06-27 MED ORDER — PROPOFOL 10 MG/ML IV BOLUS
INTRAVENOUS | Status: AC
  Filled 2016-06-27: qty 20

## 2016-06-27 MED ORDER — GLYCOPYRROLATE 0.2 MG/ML IJ SOLN
INTRAMUSCULAR | Status: DC | PRN
  Administered 2016-06-27: .2 mg via INTRAVENOUS

## 2016-06-27 MED ORDER — PROPOFOL 500 MG/50ML IV EMUL
INTRAVENOUS | Status: DC | PRN
  Administered 2016-06-27: 250 ug/kg/min via INTRAVENOUS

## 2016-06-27 MED ORDER — GLYCOPYRROLATE 0.2 MG/ML IJ SOLN
INTRAMUSCULAR | Status: AC
  Filled 2016-06-27: qty 1

## 2016-06-27 MED ORDER — LACTATED RINGERS IV SOLN
INTRAVENOUS | Status: DC | PRN
Start: 2016-06-27 — End: 2016-06-27
  Administered 2016-06-27: 10:00:00 via INTRAVENOUS

## 2016-06-27 MED ORDER — ONDANSETRON HCL 4 MG/2ML IJ SOLN
INTRAMUSCULAR | Status: AC
  Filled 2016-06-27: qty 2

## 2016-06-27 NOTE — Discharge Instructions (Signed)

## 2016-06-27 NOTE — Op Note (Signed)
[facility] Patient Name: Crystal Small Procedure Date: 06/27/2016 MRN: [id number] Attending MD: Milus Banister , MD Date of Birth: [birth year] CSN: [id number] Age: 81 Admit Type: Outpatient Procedure:                Upper EUS Indications:              Suspected mass in liver on CT scan, Suspected mass                            in pancreas on CT scan Providers:                Milus Banister, MD, Zenon Mayo, RN, Alfonso Patten,                            Technician, Enrigue Catena, CRNA Referring MD:             Zenovia Jarred, MD Medicines:                Monitored Anesthesia Care Complications:            No immediate complications. Estimated blood loss:                            None. Estimated Blood Loss:     Estimated blood loss: none. Procedure:                Pre-Anesthesia Assessment:                           - Prior to the procedure, a History and Physical                            was performed, and patient medications and                            allergies were reviewed. The patient's tolerance of                            previous anesthesia was also reviewed. The risks                            and benefits of the procedure and the sedation                            options and risks were discussed with the patient.                            All questions were answered, and informed consent                            was obtained. Prior Anticoagulants: The patient has                            taken no previous anticoagulant or antiplatelet  agents. ASA Grade Assessment: II - A patient with                            mild systemic disease. After reviewing the risks                            and benefits, the patient was deemed in                            satisfactory condition to undergo the procedure.                           After obtaining informed consent, the endoscope was                            passed under direct  vision. Throughout the                            procedure, the patient's blood pressure, pulse, and                            oxygen saturations were monitored continuously. The                            [id number] scope was introduced through                            the mouth, and advanced to the distal stomach. The                            upper EUS was accomplished without difficulty. The                            patient tolerated the procedure well. Scope In: Scope Out: Findings:      Endoscopic Finding :      1. The examined esophagus was endoscopically normal.      2. The entire examined stomach was endoscopically normal.      Endosonographic Finding (targeted exam for tissue acuqisition): :      1. A round mass was identified endosonographically in the left lobe of       the liver. The mass was hyperechoic and heterogenous. The mass measured       16 mm in maximal cross-sectional diameter. The endosonographic borders       were poorly-defined. Fine needle aspiration for cytology was performed.       Color Doppler imaging was utilized prior to needle puncture to confirm a       lack of significant vascular structures within the needle path. One pass       was made with the 25 gauge needle using a transgastric approach. No       stylet was used. A cytotechnologist was present to evaluate the adequacy       of the specimen. Final cytology results are pending.      2. There was a large, hypoechoic, heterogeneous mass in the pancreatic  body with irregular outer borders. This measured 5cm maximally. Impression:               -Large mass in pancreatic body (not sampled today).                            1.6cm mass in left lobe of liver, suspicious for                            metastatic lesion on this exam and also by recent                            CT. Preliminary cytology review of FNA from the                            liver lesion is positive for  malignancy (carcinoma,                            suspicious for adenocarcinoma). Await final                            cytology results, but this is likely metastatic                            pancreatic adenocarcinoma (Stage IV). Moderate Sedation:      N/A- Per Anesthesia Care Recommendation:           - Discharge patient to home (ambulatory).                           - Await final cytology results.                           - Continue with your scheduled appt with Dr.                            Benay Spice next Tuesday afternoon. Procedure Code(s):        --- Professional ---                           [phone], Esophagogastroduodenoscopy, flexible,                            transoral; with transendoscopic ultrasound-guided                            intramural or transmural fine needle                            aspiration/biopsy(s), (includes endoscopic                            ultrasound examination limited to the esophagus,                            stomach or duodenum, and adjacent structures) Diagnosis Code(s):        ---  Professional ---                           R16.0, Hepatomegaly, not elsewhere classified                           R93.3, Abnormal findings on diagnostic imaging of                            other parts of digestive tract                           R93.2, Abnormal findings on diagnostic imaging of                            liver and biliary tract CPT copyright 2016 American Medical Association. All rights reserved. The codes documented in this report are preliminary and upon coder review may  be revised to meet current compliance requirements. Milus Banister, MD 06/27/2016 11:05:05 AM This report has been signed electronically. Number of Addenda: 0

## 2016-06-27 NOTE — Anesthesia Postprocedure Evaluation (Signed)
Anesthesia Post Note  Patient: Crystal Small  Procedure(s) Performed: Procedure(s) (LRB): ESOPHAGEAL ENDOSCOPIC ULTRASOUND (EUS) RADIAL (N/A)  Patient location during evaluation: PACU Anesthesia Type: MAC Level of consciousness: awake, awake and alert and oriented Pain management: pain level controlled Vital Signs Assessment: post-procedure vital signs reviewed and stable Respiratory status: spontaneous breathing, nonlabored ventilation and respiratory function stable Cardiovascular status: blood pressure returned to baseline and stable Anesthetic complications: no    Last Vitals:  Vitals:   06/27/16 1110 06/27/16 1120  BP: (!) 102/53 (!) 116/54  Pulse: 68   Resp: 16   Temp:      Last Pain:  Vitals:   06/27/16 1106  TempSrc: Oral  PainSc:                  Reginal Lutes

## 2016-06-27 NOTE — Transfer of Care (Signed)
Immediate Anesthesia Transfer of Care Note  Patient: Crystal Small  Procedure(s) Performed: Procedure(s): ESOPHAGEAL ENDOSCOPIC ULTRASOUND (EUS) RADIAL (N/A)  Patient Location: PACU  Anesthesia Type:MAC  Level of Consciousness: awake, alert , oriented and patient cooperative  Airway & Oxygen Therapy: Patient Spontanous Breathing and Patient connected to nasal cannula oxygen  Post-op Assessment: Report given to RN, Post -op Vital signs reviewed and stable and Patient moving all extremities X 4  Post vital signs: stable  Last Vitals:  Vitals:   06/27/16 1110 06/27/16 1120  BP: (!) 102/53 (!) 116/54  Pulse: 68   Resp: 16   Temp:      Last Pain:  Vitals:   06/27/16 1106  TempSrc: Oral  PainSc:       Patients Stated Pain Goal: 0 ([id number])  Complications: No apparent anesthesia complications

## 2016-06-27 NOTE — H&P (View-Only) (Signed)
Subjective:    Patient ID: Crystal Small, female    DOB: [birth year], 81 y.o.   MRN: [id number]  HPI Crystal Small is a 81 year old female with a past medical history of GERD, hyperlipidemia and arthritis who is here to discuss new onset left lower quadrant abdominal pain and weight loss. She was seen here in August 2016 to discuss screening/surveillance colonoscopy. The decision was made to pursue Cologuard testing which was performed and negative. She is here today with her daughter  She reports over the last several months she's developed a soreness and at times pain in her left lower quadrant. This feels like a swelling and can at times become severe. It seems to be worse with eating though she is unaware of specific dietary trigger. She has been avoiding lactose and this is not changed the pain but has improved her reflux symptoms. Pain at times seems to be worse with lying down. She reports getting up and walking around to try to help the pain. Because of the associated worsening with eating she has avoided eating full meals. At times she feels full quickly. She denies nausea and vomiting. She has lost 14 pounds since her last visit here 11 months ago. She feels like she has lost 8 pounds from December 16 of June 17 and 4 pounds June. She reports her bowel movements is regular at times they are small and other times they're larger and more normal size. She states they are formed without blood or melena. Denies constipation and diarrhea. For her reflux she's been using omeprazole 40 mg daily and very rarely over-the-counter Zantac.  She had labs performed very recently by primary care which she has a copy of have reviewed. CMP within normal limits. Total bilirubin 0.7, AST 19, ALT 15, albumin 3.8. CBC: WBC 7.0, hemoglobin 13.3, MCV 100.3, platelet 211 Vitamin D normal  Review of Systems As per history of present illness, otherwise negative  Current Medications, Allergies, Past Medical History,  Past Surgical History, Family History and Social History were reviewed in [employer] record.     Objective:   Physical Exam BP 112/70 mmHg  Pulse 64  Ht 5\' 4"  (1.626 m)  Wt 114 lb 9.6 oz (51.982 kg)  BMI 19.66 kg/m2 Constitutional: Well-developed and well-nourished. No distress. HEENT: Normocephalic and atraumatic. Oropharynx is clear and moist. No oropharyngeal exudate. Conjunctivae are normal.  No scleral icterus. Neck: Neck supple. Trachea midline. Cardiovascular: Normal rate, regular rhythm and intact distal pulses. No M/R/G Pulmonary/chest: Effort normal and breath sounds normal. No wheezing, rales or rhonchi. Abdominal: Soft, Moderate tenderness in the left mid and lower quadrant and suprapubic abdomen without rebound or guarding, nondistended. Bowel sounds active throughout. No HSM Extremities: no clubbing, cyanosis, or edema Lymphadenopathy: No cervical adenopathy noted. Neurological: Alert and oriented to person place and time. Skin: Skin is warm and dry. No rashes noted. Psychiatric: Normal mood and affect. Behavior is normal.  Last colonoscopy 04/08/2005 -- normal. Preparation excellent.     Assessment & Plan:  81 year old female with a past medical history of GERD, hyperlipidemia and arthritis who is here to discuss new onset left lower quadrant abdominal pain and weight loss.   1. LLQ pain/weight loss -- Her left lower quadrant pain and weight loss is concerning though she does not seem to have any change in bowel habit. Her labs performed on 05/14/2016 were reviewed and normal, which is reassuring. Cologuard recently negative, also reassuring. I recommended CT scan of the abdomen  and pelvis with IV contrast to further evaluate this pain, rule out diverticulitis and colitis. If negative consider upper endoscopy and colonoscopy versus flexible sigmoidoscopy. If GI evaluation unremarkable would recommend evaluating the thoracic and lumbar spine which  could cause referred pain given her history of arthritis. Will give Levsin 0.125 mg sublingual every 4-6 hours for abdominal pain while awaiting workup.  25 minutes spent with the patient today. Greater than 50% was spent in counseling and coordination of care with the patient

## 2016-06-27 NOTE — Interval H&P Note (Signed)
History and Physical Interval Note:  06/27/2016 10:02 AM  [name]  has presented today for surgery, with the diagnosis of pancreatic mass   The various methods of treatment have been discussed with the patient and family. After consideration of risks, benefits and other options for treatment, the patient has consented to  Procedure(s): ESOPHAGEAL ENDOSCOPIC ULTRASOUND (EUS) RADIAL (N/A) as a surgical intervention .  The patient's history has been reviewed, patient examined, no change in status, stable for surgery.  I have reviewed the patient's chart and labs.  Questions were answered to the patient's satisfaction.     Crystal Small

## 2016-06-27 NOTE — Interval H&P Note (Signed)
History and Physical Interval Note:  06/27/2016 10:03 AM  [name]  has presented today for surgery, with the diagnosis of pancreatic mass   The various methods of treatment have been discussed with the patient and family. After consideration of risks, benefits and other options for treatment, the patient has consented to  Procedure(s): ESOPHAGEAL ENDOSCOPIC ULTRASOUND (EUS) RADIAL (N/A) as a surgical intervention .  The patient's history has been reviewed, patient examined, no change in status, stable for surgery.  I have reviewed the patient's chart and labs.  Questions were answered to the patient's satisfaction.     Crystal Small

## 2016-06-27 NOTE — Anesthesia Preprocedure Evaluation (Addendum)
Anesthesia Evaluation  Patient identified by MRN, date of birth, ID band Patient awake    Reviewed: Allergy & Precautions, NPO status , Patient's Chart, lab work & pertinent test results  History of Anesthesia Complications Negative for: history of anesthetic complications  Airway Mallampati: II  TM Distance: >3 FB     Dental no notable dental hx.    Pulmonary neg pulmonary ROS,    Pulmonary exam normal        Cardiovascular negative cardio ROS Normal cardiovascular exam     Neuro/Psych negative neurological ROS  negative psych ROS   GI/Hepatic GERD  ,History of current pancreatic mass hemorrhoids   Endo/Other  negative endocrine ROS  Renal/GU negative Renal ROS  negative genitourinary   Musculoskeletal   Abdominal   Peds negative pediatric ROS (+)  Hematology  (+) Blood dyscrasia (hb), anemia ,   Anesthesia Other Findings   Reproductive/Obstetrics                            Anesthesia Physical Anesthesia Plan  ASA: II  Anesthesia Plan: MAC   Post-op Pain Management:    Induction:   Airway Management Planned: Nasal Cannula  Additional Equipment:   Intra-op Plan:   Post-operative Plan:   Informed Consent: I have reviewed the patients History and Physical, chart, labs and discussed the procedure including the risks, benefits and alternatives for the proposed anesthesia with the patient or authorized representative who has indicated his/her understanding and acceptance.     Plan Discussed with: CRNA  Anesthesia Plan Comments:         Anesthesia Quick Evaluation

## 2016-06-28 ENCOUNTER — Encounter (HOSPITAL_COMMUNITY): Payer: Self-pay | Admitting: Gastroenterology

## 2016-07-01 NOTE — Addendum Note (Signed)
Addendum  created 07/01/16 1420 by Reginal Lutes, MD   Anesthesia Staff edited

## 2016-07-02 ENCOUNTER — Telehealth: Payer: Self-pay | Admitting: *Deleted

## 2016-07-02 ENCOUNTER — Encounter: Payer: Self-pay | Admitting: *Deleted

## 2016-07-02 ENCOUNTER — Telehealth: Payer: Self-pay | Admitting: Oncology

## 2016-07-02 ENCOUNTER — Ambulatory Visit (HOSPITAL_BASED_OUTPATIENT_CLINIC_OR_DEPARTMENT_OTHER): Payer: Medicare Other | Admitting: Oncology

## 2016-07-02 VITALS — BP 121/58 | HR 80 | Temp 98.5°F | Resp 17 | Ht 66.0 in | Wt 112.8 lb

## 2016-07-02 DIAGNOSIS — C251 Malignant neoplasm of body of pancreas: Secondary | ICD-10-CM

## 2016-07-02 DIAGNOSIS — C787 Secondary malignant neoplasm of liver and intrahepatic bile duct: Secondary | ICD-10-CM

## 2016-07-02 DIAGNOSIS — R634 Abnormal weight loss: Secondary | ICD-10-CM

## 2016-07-02 DIAGNOSIS — R109 Unspecified abdominal pain: Secondary | ICD-10-CM

## 2016-07-02 DIAGNOSIS — R630 Anorexia: Secondary | ICD-10-CM

## 2016-07-02 MED ORDER — PROCHLORPERAZINE MALEATE 10 MG PO TABS: 10.0000 mg | ORAL_TABLET | Freq: Four times a day (QID) | ORAL | 1 refills | Status: DC | PRN

## 2016-07-02 MED ORDER — LIDOCAINE-PRILOCAINE 2.5-2.5 % EX CREA: 1.0000 "application " | TOPICAL_CREAM | CUTANEOUS | 11 refills | Status: DC | PRN

## 2016-07-02 NOTE — Telephone Encounter (Signed)
Per staff phone call and POF I have schedueld appts. Scheduler advised of appts.  JMW  

## 2016-07-02 NOTE — Progress Notes (Signed)
[facility] Patient Consult   Referring MD: Pyrtle,Jay  Neketa Lace 81 y.o.  [birth year]    Reason for Referral: Pancreas cancer   HPI: Ms. Crystal Small reports a one-year history of left lower abdominal pain. The pain has been progressive over the past several months. She was referred to Dr. Hilarie Fredrickson. A CT of the abdomen and pelvis on 06/14/2016 revealed multiple hypodense lesions in the liver consistent with metastases. A hypodense mass was seen in the pancreas body and tail suspicious for a primary tumor. The mass was noted to abut and narrow the superior mesenteric vein and causes splenic vein thrombosis. Less than 1 cm peripancreatic lymph nodes were noted. Old compression deformity of T12. She was referred to interventional radiology for a biopsy of a liver lesion on 06/19/2016. The liver lesions could not be identified by ultrasound. She was referred to Dr. Ardis Hughs and was taken to an endoscopic ultrasound on 06/27/2016. A mass was identified in the left lobe of the liver. A fine-needle aspiration was performed. A hypoechoic mass was noted in the pancreas body with irregular borders measuring 5 cm. The cytology [id number]) confirmed poorly differentiated carcinoma with abundant background necrosis. The cytology suggestive of poorly differentiated adenocarcinoma with areas of squamous differentiation.  She continues to have left lower abdominal pain. The pain is relieved with hydrocodone.  Past Medical History:  Diagnosis Date  . GERD (gastroesophageal reflux disease)   . Hemorrhoids   . Hyperlipidemia   . Pancreatic Cancer  August 2017     .  G2 P2  Past Surgical History:  Procedure Laterality Date  . ABDOMINAL HYSTERECTOMY  1990   COMPLETE  . COLONOSCOPY  2006  . EUS N/A 06/27/2016   Procedure: ESOPHAGEAL ENDOSCOPIC ULTRASOUND (EUS) RADIAL;  Surgeon: Milus Banister, MD;  Location: [facility];  Service: Endoscopy;  Laterality: N/A;  . LIVER BIOPSY  06/19/2016   WITH ULTRASOUND    Medications: Reviewed  Allergies: No Known Allergies  Family history: She had [place]. A brother had lung cancer in his 70s-smoker, brother had lung cancer in 60s-smoker, a sister had ovarian cancer her 45s  Social History:   She lives alone in [place]. She previously worked in an office occupation. She does not use tobacco or alcohol. No transfusion history. No risk factor for HIV or hepatitis.     ROS:   Positives include: Anorexia, 14 pound weight loss, left lower abdominal pain-worse after eating  A complete ROS was otherwise negative.  Physical Exam:  Blood pressure (!) 121/58, pulse 80, temperature 98.5 F (36.9 C), temperature source Oral, resp. rate 17, height 5\' 6"  (1.676 m), weight 112 lb 12.8 oz (51.2 kg), SpO2 97 %.  HEENT: Oropharynx without visible mass, neck without mass Lungs: Clear bilaterally Cardiac: Regular rate and rhythm Abdomen: No hepatosplenomegaly, no apparent ascites, no mass, mild tenderness in the left lower abdomen  Vascular: No leg edema Lymph nodes: No cervical, supraclavicular, axillary, or inguinal nodes Neurologic: Alert and oriented, the motor exam appears intact in the upper and lower extremities Skin: No rash Musculoskeletal: No spine tenderness, kyphoscoliosis   LAB:  CBC  Lab Results  Component Value Date   WBC 9.2 06/19/2016   HGB 11.2 (L) 06/19/2016   HCT 34.7 (L) 06/19/2016   MCV 93.5 06/19/2016   PLT 222 06/19/2016     Imaging:  As per history of present illness-CT abdomen/pelvis 06/14/2016: Images reviewed with Ms. Waner and her daughters   Assessment/Plan:   1.  Pancreas cancer, pancreas body/tail mass-FNA biopsy of a left liver lesion by EUS on 06/27/2016 confirmed poorly differential adenocarcinoma (features of adenocarcinoma with a degree of squamous differentiation)  CT abdomen/pelvis on 06/14/2016 revealed a pancreas body and tail mass with involvement of the superior mesenteric vein  and splenic vein thrombosis, multiple liver metastases  2.   Left abdominal pain secondary to #1  3.   Anorexia/weight loss   Disposition:   Ms. Jeangilles has been diagnosed with metastatic pancreas cancer. I discussed the diagnosis, prognosis, and treatment options with Ms. Mihara and her daughters. We reviewed the CT images. She understands no therapy will be curative. We discussed supportive/comfort care versus a trial of systemic therapy. She would like to proceed with chemotherapy.  I discussed the potential toxicities associated with the gemcitabine/Abraxane regimen and the expected chance of clinical benefit. We discussed the potential for nausea/vomiting, alopecia, and hematologic toxicity. We discussed the chance of a rash, fever, and pneumonitis with gemcitabine. We discussed the neuropathy associated with Abraxane. She will attend a chemotherapy teaching class.  Ms. Kizewski agrees to proceed with a first cycle of gemcitabine/Abraxane on 07/11/2016. She will be referred for placement of a Port-A-Cath.  She will return for an office visit and cycle 2 chemotherapy on 07/25/2016. We we will check a baseline CA 19-9 when she returns for the chemotherapy teaching class. Her case will be presented at the GI tumor conference.  Approximately 50 minutes were spent with patient today. The majority of time was used for counseling and coordination of care.  [name], MD  07/02/2016, 5:19 PM

## 2016-07-02 NOTE — Progress Notes (Signed)
Oncology Nurse Navigator Documentation  Oncology Nurse Navigator Flowsheets 07/02/2016  Navigator Location [facility]Med Onc  Navigator Encounter Type Initial MedOnc  Telephone -  Abnormal Finding Date -  Confirmed Diagnosis Date 06/27/2016  Patient Visit Type MedOnc;Initial  Treatment Phase Pre-Tx/Tx Discussion  Barriers/Navigation Needs Education;Coordination of Care  Education Accessing Care/ Finding Providers;Understanding Cancer/ Treatment Options;Coping with Diagnosis/ Prognosis;Pain/ Symptom Management;Newly Diagnosed Cancer Education;Preparing for Upcoming Surgery/ Treatment  Interventions Coordination of Care;Referrals;Education Method  Referrals Nutrition/dietician  Coordination of Care Radiology--IR for [facility]  Education Method Verbal;Written;Teach-back  Support Groups/Services GI Support Group;[place];Other--[facility] support calendar  Acuity Level 2  Time Spent with Patient 4  Met with patient and daughters, Crystal Small and Crystal Small during new patient visit. Explained the role of the GI Nurse Navigator and provided New Patient Packet with information on: 1. Pancreas cancer--CA 19.9 test, PAC, anatomy of pancreas info reviewed 2. Support groups 3. Advanced Directives 4. Fall Safety Plan Answered questions, reviewed current treatment plan using TEACH back and provided emotional support. Provided copy of current treatment plan. Crystal Small is widowed and lives alone in [place]. She has two grown daughters that live in the area. She is independent in all ADLS and drives. She exercises/walks regularly and up to last several months had a good appetite. Currently has lost her appetite and lost about 14 lb in last several months. She has abdominal pain when she eats. Bowel/bladder is normal. Answered all her questions and escorted her and her family to scheduling department.  Merceda Elks, RN, BSN GI Oncology [place]

## 2016-07-02 NOTE — Telephone Encounter (Signed)
Gave pt cal & avs °

## 2016-07-04 ENCOUNTER — Other Ambulatory Visit (HOSPITAL_BASED_OUTPATIENT_CLINIC_OR_DEPARTMENT_OTHER): Payer: Medicare Other

## 2016-07-04 ENCOUNTER — Encounter: Payer: Self-pay | Admitting: *Deleted

## 2016-07-04 ENCOUNTER — Other Ambulatory Visit: Payer: Medicare Other

## 2016-07-04 ENCOUNTER — Encounter: Payer: Self-pay | Admitting: General Practice

## 2016-07-04 DIAGNOSIS — C251 Malignant neoplasm of body of pancreas: Secondary | ICD-10-CM

## 2016-07-04 LAB — COMPREHENSIVE METABOLIC PANEL
ALT: 36 U/L (ref 0–55)
ANION GAP: 8 meq/L (ref 3–11)
AST: 44 U/L — ABNORMAL HIGH (ref 5–34)
Albumin: 2.8 g/dL — ABNORMAL LOW (ref 3.5–5.0)
Alkaline Phosphatase: 77 U/L (ref 40–150)
BILIRUBIN TOTAL: 0.62 mg/dL (ref 0.20–1.20)
BUN: 13.4 mg/dL (ref 7.0–26.0)
CO2: 27 meq/L (ref 22–29)
Calcium: 9.3 mg/dL (ref 8.4–10.4)
Chloride: 98 mEq/L (ref 98–109)
Creatinine: 0.7 mg/dL (ref 0.6–1.1)
EGFR: 84 mL/min/{1.73_m2} — AB (ref >90)
Glucose: 116 mg/dl (ref 70–140)
Potassium: 4.1 mEq/L (ref 3.5–5.1)
Sodium: 133 mEq/L — ABNORMAL LOW (ref 136–145)
TOTAL PROTEIN: 6.9 g/dL (ref 6.4–8.3)

## 2016-07-04 LAB — CBC WITH DIFFERENTIAL/PLATELET
BASO%: 0.4 % (ref 0.0–2.0)
BASOS ABS: 0.1 10*3/uL (ref 0.0–0.1)
EOS ABS: 0.2 10*3/uL (ref 0.0–0.5)
EOS%: 1.1 % (ref 0.0–7.0)
HCT: 30.2 % — ABNORMAL LOW (ref 34.8–46.6)
HGB: 9.8 g/dL — ABNORMAL LOW (ref 11.6–15.9)
LYMPH%: 9.8 % — AB (ref 14.0–49.7)
MCH: 29.9 pg (ref 25.1–34.0)
MCHC: 32.3 g/dL (ref 31.5–36.0)
MCV: 92.4 fL (ref 79.5–101.0)
MONO#: 1.3 10*3/uL — AB (ref 0.1–0.9)
MONO%: 9.5 % (ref 0.0–14.0)
NEUT%: 79.2 % — AB (ref 38.4–76.8)
NEUTROS ABS: 10.8 10*3/uL — AB (ref 1.5–6.5)
PLATELETS: 279 10*3/uL (ref 145–400)
RBC: 3.27 10*6/uL — AB (ref 3.70–5.45)
RDW: 12.7 % (ref 11.2–14.5)
WBC: 13.7 10*3/uL — ABNORMAL HIGH (ref 3.9–10.3)
lymph#: 1.3 10*3/uL (ref 0.9–3.3)

## 2016-07-04 NOTE — Progress Notes (Signed)
Spiritual Care Note  Met Ms jaimey, franchini, and Rhonda's SO in chemo education class, introducing [facility] team/resources.  Normalized variety of feelings, as well as caregiver stress, often associated with adjusting to dx/tx. Answered questions about Advance Directives.  Encouraged pt and family to reach out to Support Team as desired and to take advantage of programming to assist with coping and meaning-making.  Please also page as needs arise/circumstances change.  Thank you.  [place], [place], [facility] Pager [phone] Voicemail [phone]

## 2016-07-05 LAB — CANCER ANTIGEN 19-9: CA 19-9: 2 U/mL (ref 0–35)

## 2016-07-07 ENCOUNTER — Other Ambulatory Visit: Payer: Self-pay | Admitting: Oncology

## 2016-07-08 ENCOUNTER — Encounter: Payer: Self-pay | Admitting: Oncology

## 2016-07-08 NOTE — Progress Notes (Signed)
Talked w/ pt regarding copay assistance w/ the [facility].  Pt would like to apply so I will complete the application and submit on 07/11/16 when pt comes for her next visit.  I will discuss the [facility] at that time as well.

## 2016-07-09 ENCOUNTER — Other Ambulatory Visit: Payer: Self-pay | Admitting: Radiology

## 2016-07-10 ENCOUNTER — Ambulatory Visit (HOSPITAL_COMMUNITY)
Admission: RE | Admit: 2016-07-10 | Discharge: 2016-07-10 | Disposition: A | Discharge status: Home / Self Care | Payer: Medicare Other | Source: Ambulatory Visit | Attending: Oncology | Admitting: Oncology

## 2016-07-10 ENCOUNTER — Other Ambulatory Visit: Payer: Self-pay | Admitting: Oncology

## 2016-07-10 ENCOUNTER — Encounter (HOSPITAL_COMMUNITY): Payer: Self-pay

## 2016-07-10 DIAGNOSIS — K649 Unspecified hemorrhoids: Secondary | ICD-10-CM | POA: Diagnosis not present

## 2016-07-10 DIAGNOSIS — C259 Malignant neoplasm of pancreas, unspecified: Secondary | ICD-10-CM | POA: Diagnosis present

## 2016-07-10 DIAGNOSIS — K219 Gastro-esophageal reflux disease without esophagitis: Secondary | ICD-10-CM | POA: Insufficient documentation

## 2016-07-10 DIAGNOSIS — Z9221 Personal history of antineoplastic chemotherapy: Secondary | ICD-10-CM | POA: Diagnosis not present

## 2016-07-10 DIAGNOSIS — E785 Hyperlipidemia, unspecified: Secondary | ICD-10-CM | POA: Diagnosis not present

## 2016-07-10 DIAGNOSIS — K769 Liver disease, unspecified: Secondary | ICD-10-CM | POA: Diagnosis not present

## 2016-07-10 DIAGNOSIS — C251 Malignant neoplasm of body of pancreas: Secondary | ICD-10-CM

## 2016-07-10 HISTORY — PX: IR GENERIC HISTORICAL: IMG1180011

## 2016-07-10 LAB — CBC WITH DIFFERENTIAL/PLATELET
BASOS PCT: 0 %
Basophils Absolute: 0 10*3/uL (ref 0.0–0.1)
EOS ABS: 0.2 10*3/uL (ref 0.0–0.7)
Eosinophils Relative: 1 %
HCT: 31.5 % — ABNORMAL LOW (ref 36.0–46.0)
HEMOGLOBIN: 10.5 g/dL — AB (ref 12.0–15.0)
Lymphocytes Relative: 8 %
Lymphs Abs: 1.5 10*3/uL (ref 0.7–4.0)
MCH: 29.7 pg (ref 26.0–34.0)
MCHC: 33.3 g/dL (ref 30.0–36.0)
MCV: 89.2 fL (ref 78.0–100.0)
Monocytes Absolute: 1.6 10*3/uL — ABNORMAL HIGH (ref 0.1–1.0)
Monocytes Relative: 8 %
NEUTROS PCT: 83 %
Neutro Abs: 15.9 10*3/uL — ABNORMAL HIGH (ref 1.7–7.7)
Platelets: 344 10*3/uL (ref 150–400)
RBC: 3.53 MIL/uL — AB (ref 3.87–5.11)
RDW: 13.6 % (ref 11.5–15.5)
WBC: 19.2 10*3/uL — AB (ref 4.0–10.5)

## 2016-07-10 LAB — PROTIME-INR
INR: 1.08
PROTHROMBIN TIME: 14 s (ref 11.4–15.2)

## 2016-07-10 MED ORDER — MIDAZOLAM HCL 2 MG/2ML IJ SOLN
INTRAMUSCULAR | Status: AC | PRN
  Administered 2016-07-10 (×2): 1 mg via INTRAVENOUS

## 2016-07-10 MED ORDER — FLUMAZENIL 0.5 MG/5ML IV SOLN
INTRAVENOUS | Status: AC
  Filled 2016-07-10: qty 5

## 2016-07-10 MED ORDER — NALOXONE HCL 0.4 MG/ML IJ SOLN
INTRAMUSCULAR | Status: AC
  Filled 2016-07-10: qty 1

## 2016-07-10 MED ORDER — MIDAZOLAM HCL 2 MG/2ML IJ SOLN
INTRAMUSCULAR | Status: AC
  Filled 2016-07-10: qty 2

## 2016-07-10 MED ORDER — SODIUM CHLORIDE 0.9 % IV SOLN
INTRAVENOUS | Status: DC
  Administered 2016-07-10: 12:00:00 via INTRAVENOUS

## 2016-07-10 MED ORDER — FENTANYL CITRATE (PF) 100 MCG/2ML IJ SOLN
INTRAMUSCULAR | Status: AC
  Filled 2016-07-10: qty 2

## 2016-07-10 MED ORDER — LIDOCAINE HCL 1 % IJ SOLN
INTRAMUSCULAR | Status: AC | PRN
  Administered 2016-07-10: 20 mL

## 2016-07-10 MED ORDER — FENTANYL CITRATE (PF) 100 MCG/2ML IJ SOLN
INTRAMUSCULAR | Status: AC | PRN
  Administered 2016-07-10 (×2): 50 ug via INTRAVENOUS

## 2016-07-10 MED ORDER — CEFAZOLIN SODIUM-DEXTROSE 2-4 GM/100ML-% IV SOLN
2.0000 g | INTRAVENOUS | Status: AC
  Administered 2016-07-10: 2 g via INTRAVENOUS
  Filled 2016-07-10: qty 100

## 2016-07-10 MED ORDER — HEPARIN SOD (PORK) LOCK FLUSH 100 UNIT/ML IV SOLN
INTRAVENOUS | Status: AC
  Filled 2016-07-10: qty 5

## 2016-07-10 NOTE — Discharge Instructions (Signed)
Moderate Conscious Sedation, Adult, Care After °Refer to this sheet in the next few weeks. These instructions provide you with information on caring for yourself after your procedure. Your health care provider may also give you more specific instructions. Your treatment has been planned according to current medical practices, but problems sometimes occur. Call your health care provider if you have any problems or questions after your procedure. °WHAT TO EXPECT AFTER THE PROCEDURE  °After your procedure: °· You may feel sleepy, clumsy, and have poor balance for several hours. °· Vomiting may occur if you eat too soon after the procedure. °HOME CARE INSTRUCTIONS °· Do not participate in any activities where you could become injured for at least 24 hours. Do not: °¨ Drive. °¨ Swim. °¨ Ride a bicycle. °¨ Operate heavy machinery. °¨ Cook. °¨ Use power tools. °¨ Climb ladders. °¨ Work from a high place. °· Do not make important decisions or sign legal documents until you are improved. °· If you vomit, drink water, juice, or soup when you can drink without vomiting. Make sure you have little or no nausea before eating solid foods. °· Only take over-the-counter or prescription medicines for pain, discomfort, or fever as directed by your health care provider. °· Make sure you and your family fully understand everything about the medicines given to you, including what side effects may occur. °· You should not drink alcohol, take sleeping pills, or take medicines that cause drowsiness for at least 24 hours. °· If you smoke, do not smoke without supervision. °· If you are feeling better, you may resume normal activities 24 hours after you were sedated. °· Keep all appointments with your health care provider. °SEEK MEDICAL CARE IF: °· Your skin is pale or bluish in color. °· You continue to feel nauseous or vomit. °· Your pain is getting worse and is not helped by medicine. °· You have bleeding or swelling. °· You are still  sleepy or feeling clumsy after 24 hours. °SEEK IMMEDIATE MEDICAL CARE IF: °· You develop a rash. °· You have difficulty breathing. °· You develop any type of allergic problem. °· You have a fever. °MAKE SURE YOU: °· Understand these instructions. °· Will watch your condition. °· Will get help right away if you are not doing well or get worse. °  °This information is not intended to replace advice given to you by your health care provider. Make sure you discuss any questions you have with your health care provider. °  °Document Released: 09/01/2013 Document Revised: 12/02/2014 Document Reviewed: 09/01/2013 °Elsevier Interactive Patient Education ©2016 Elsevier Inc. °Implanted Port Insertion, Care After °Refer to this sheet in the next few weeks. These instructions provide you with information on caring for yourself after your procedure. Your health care provider may also give you more specific instructions. Your treatment has been planned according to current medical practices, but problems sometimes occur. Call your health care provider if you have any problems or questions after your procedure. °WHAT TO EXPECT AFTER THE PROCEDURE °After your procedure, it is typical to have the following:  °· Discomfort at the port insertion site. Ice packs to the area will help. °· Bruising on the skin over the port. This will subside in 3-4 days. °HOME CARE INSTRUCTIONS °· After your port is placed, you will get a manufacturer's information card. The card has information about your port. Keep this card with you at all times.   °· Know what kind of port you have. There are many types   of ports available.   °· Wear a medical alert bracelet in case of an emergency. This can help alert health care workers that you have a port.   °· The port can stay in for as long as your health care provider believes it is necessary.   °· A home health care nurse may give medicines and take care of the port.   °· You or a family member can get  special training and directions for giving medicine and taking care of the port at home.   °SEEK MEDICAL CARE IF:  °· Your port does not flush or you are unable to get a blood return.   °· You have a fever or chills. °SEEK IMMEDIATE MEDICAL CARE IF: °· You have new fluid or pus coming from your incision.   °· You notice a bad smell coming from your incision site.   °· You have swelling, pain, or more redness at the incision or port site.   °· You have chest pain or shortness of breath. °  °This information is not intended to replace advice given to you by your health care provider. Make sure you discuss any questions you have with your health care provider. °  °Document Released: 09/01/2013 Document Revised: 11/16/2013 Document Reviewed: 09/01/2013 °Elsevier Interactive Patient Education ©2016 Elsevier Inc. °Implanted Port Home Guide °An implanted port is a type of central line that is placed under the skin. Central lines are used to provide IV access when treatment or nutrition needs to be given through a person's veins. Implanted ports are used for long-term IV access. An implanted port may be placed because:  °· You need IV medicine that would be irritating to the small veins in your hands or arms.   °· You need long-term IV medicines, such as antibiotics.   °· You need IV nutrition for a long period.   °· You need frequent blood draws for lab tests.   °· You need dialysis.   °Implanted ports are usually placed in the chest area, but they can also be placed in the upper arm, the abdomen, or the leg. An implanted port has two main parts:  °· Reservoir. The reservoir is round and will appear as a small, raised area under your skin. The reservoir is the part where a needle is inserted to give medicines or draw blood.   °· Catheter. The catheter is a thin, flexible tube that extends from the reservoir. The catheter is placed into a large vein. Medicine that is inserted into the reservoir goes into the catheter and  then into the vein.   °HOW WILL I CARE FOR MY INCISION SITE? °Do not get the incision site wet. Bathe or shower as directed by your health care provider.  °HOW IS MY PORT ACCESSED? °Special steps must be taken to access the port:  °· Before the port is accessed, a numbing cream can be placed on the skin. This helps numb the skin over the port site.   °· Your health care provider uses a sterile technique to access the port. °· Your health care provider must put on a mask and sterile gloves. °· The skin over your port is cleaned carefully with an antiseptic and allowed to dry. °· The port is gently pinched between sterile gloves, and a needle is inserted into the port. °· Only "non-coring" port needles should be used to access the port. Once the port is accessed, a blood return should be checked. This helps ensure that the port is in the vein and is not clogged.   °· If your port   needs to remain accessed for a constant infusion, a clear (transparent) bandage will be placed over the needle site. The bandage and needle will need to be changed every week, or as directed by your health care provider.   °· Keep the bandage covering the needle clean and dry. Do not get it wet. Follow your health care provider's instructions on how to take a shower or bath while the port is accessed.   °· If your port does not need to stay accessed, no bandage is needed over the port.   °WHAT IS FLUSHING? °Flushing helps keep the port from getting clogged. Follow your health care provider's instructions on how and when to flush the port. Ports are usually flushed with saline solution or a medicine called heparin. The need for flushing will depend on how the port is used.  °· If the port is used for intermittent medicines or blood draws, the port will need to be flushed:   °· After medicines have been given.   °· After blood has been drawn.   °· As part of routine maintenance.   °· If a constant infusion is running, the port may not need to  be flushed.   °HOW LONG WILL MY PORT STAY IMPLANTED? °The port can stay in for as long as your health care provider thinks it is needed. When it is time for the port to come out, surgery will be done to remove it. The procedure is similar to the one performed when the port was put in.  °WHEN SHOULD I SEEK IMMEDIATE MEDICAL CARE? °When you have an implanted port, you should seek immediate medical care if:  °· You notice a bad smell coming from the incision site.   °· You have swelling, redness, or drainage at the incision site.   °· You have more swelling or pain at the port site or the surrounding area.   °· You have a fever that is not controlled with medicine. °  °This information is not intended to replace advice given to you by your health care provider. Make sure you discuss any questions you have with your health care provider. °  °Document Released: 11/11/2005 Document Revised: 09/01/2013 Document Reviewed: 07/19/2013 °Elsevier Interactive Patient Education ©2016 Elsevier Inc. ° °

## 2016-07-10 NOTE — Procedures (Signed)
Interventional Radiology Procedure Note  Procedure: Placement of a right IJ approach single lumen PowerPort.  Tip is positioned at the superior cavoatrial junction and catheter is ready for immediate use.  Complications: No immediate Recommendations:  - Ok to shower tomorrow - Do not submerge for 7 days - Routine line care   Signed,  Heath K. McCullough, MD   

## 2016-07-10 NOTE — Progress Notes (Signed)
Patient ID: Crystal Small, female   DOB: November 07, 1935, 81 y.o.   MRN: [id number]    Referring Physician(s): Ladell Pier  Supervising Physician: Jacqulynn Cadet  Patient Status:  Outpatient  Chief Complaint:  "I'm getting a port a cath"  Subjective: Patient familiar to IR service from prior attempt at liver lesion biopsy on 06/19/16, however lesion was not adequately visualized on ultrasound and therefore biopsy was cancelled. She subsequently underwent EUS guided FNA of left lobe liver mass by Dr. Ardis Hughs on 06/27/16 with pathology revealing poorly differentiated carcinoma. She also has a known 5.7 cm mass in the pancreatic body and tail most consistent with adenocarcinoma. She presents again today for Port-A-Cath placement for chemotherapy. She currently denies fever, headache, chest pain, dyspnea, cough, nausea, vomiting or abnormal bleeding. She does have abdominal and back pain, anorexia, weight loss, and occasional night sweats. Additional history as below. Past Medical History:  Diagnosis Date  . GERD (gastroesophageal reflux disease)   . Hemorrhoids   . Hyperlipidemia   . Pancreatic mass    Past Surgical History:  Procedure Laterality Date  . ABDOMINAL HYSTERECTOMY  1990   COMPLETE  . COLONOSCOPY  2006  . EUS N/A 06/27/2016   Procedure: ESOPHAGEAL ENDOSCOPIC ULTRASOUND (EUS) RADIAL;  Surgeon: Milus Banister, MD;  Location: [facility];  Service: Endoscopy;  Laterality: N/A;  . LIVER BIOPSY  06/19/2016   WITH ULTRASOUND      Allergies: Review of patient's allergies indicates no known allergies.  Medications: Prior to Admission medications   Medication Sig Start Date End Date Taking? Authorizing Provider  HYDROcodone-acetaminophen (NORCO/VICODIN) 5-325 MG tablet Take 1 tablet by mouth every 6-8 hours as needed for abdominal pain 06/17/16  Yes Amy S Esterwood, PA-C  omeprazole (PRILOSEC) 40 MG capsule Take 40 mg by mouth daily.  04/25/15  Yes Historical Provider, MD    lidocaine-prilocaine (EMLA) cream Apply 1 application topically as needed. 07/02/16   Ladell Pier, MD  prochlorperazine (COMPAZINE) 10 MG tablet Take 1 tablet (10 mg total) by mouth every 6 (six) hours as needed for nausea. 07/02/16   Ladell Pier, MD     Vital Signs: BP (!) 112/50 (BP Location: Right Arm)   Pulse 86   Temp 98.5 F (36.9 C) (Oral)   Resp 16   SpO2 100%   Physical Exam patient awake, alert. Chest clear to auscultation bilaterally. Heart with regular rate and rhythm. Abdomen soft, mild generalized tenderness to palpation, primarily epigastric region, positive bowel sounds; lower extremities with no edema.  Imaging: No results found.  Labs:  CBC:  Recent Labs  06/19/16 1216 07/04/16 1201 07/10/16 1205  WBC 9.2 13.7* 19.2*  HGB 11.2* 9.8* 10.5*  HCT 34.7* 30.2* 31.5*  PLT 222 279 344    COAGS:  Recent Labs  06/19/16 1216  INR 1.09  APTT 32    BMP:  Recent Labs  07/04/16 1201  NA 133*  K 4.1  CO2 27  GLUCOSE 116  BUN 13.4  CALCIUM 9.3  CREATININE 0.7    LIVER FUNCTION TESTS:  Recent Labs  07/04/16 1201  BILITOT 0.62  AST 44*  ALT 36  ALKPHOS 77  PROT 6.9  ALBUMIN 2.8*    Assessment and Plan: Patient with known pancreatic mass, recent liver lesion bx with cytology revealing poorly differentiated carcinoma; plan today is for Port-A-Cath placement for chemotherapy.Risks and benefits discussed with the patient/daughter including, but not limited to bleeding, infection, damage to adjacent structures or low yield requiring additional  tests.All of the patient's questions were answered, patient is agreeable to proceed.Consent signed and in chart. WBC today is 19.2. Pt afebrile with no hx recent infections or medications to cause leukocytosis; most likely tumor related. Above d/w Dr. Benay Spice and he is ok with proceeding with port placement today. Dr. Laurence Ferrari aware also.    Electronically Signed: D. Rowe Robert 07/10/2016, 12:30  PM   I spent a total of 20 minutes at the the patient's bedside AND on the patient's hospital floor or unit, greater than 50% of which was counseling/coordinating care for port a cath placement

## 2016-07-11 ENCOUNTER — Ambulatory Visit (HOSPITAL_BASED_OUTPATIENT_CLINIC_OR_DEPARTMENT_OTHER): Payer: Medicare Other

## 2016-07-11 ENCOUNTER — Ambulatory Visit: Payer: Medicare Other | Admitting: Nutrition

## 2016-07-11 ENCOUNTER — Encounter: Payer: Self-pay | Admitting: Oncology

## 2016-07-11 ENCOUNTER — Other Ambulatory Visit: Payer: Medicare Other

## 2016-07-11 ENCOUNTER — Encounter: Payer: Self-pay | Admitting: *Deleted

## 2016-07-11 VITALS — BP 118/76 | HR 86 | Temp 99.2°F | Resp 20

## 2016-07-11 DIAGNOSIS — Z5111 Encounter for antineoplastic chemotherapy: Secondary | ICD-10-CM | POA: Diagnosis not present

## 2016-07-11 DIAGNOSIS — C251 Malignant neoplasm of body of pancreas: Secondary | ICD-10-CM

## 2016-07-11 MED ORDER — PROCHLORPERAZINE MALEATE 10 MG PO TABS
10.0000 mg | ORAL_TABLET | Freq: Once | ORAL | Status: AC
  Administered 2016-07-11: 10 mg via ORAL

## 2016-07-11 MED ORDER — SODIUM CHLORIDE 0.9 % IV SOLN: 1200.0000 mg | Freq: Once | INTRAVENOUS | Status: DC

## 2016-07-11 MED ORDER — SODIUM CHLORIDE 0.9 % IV SOLN
Freq: Once | INTRAVENOUS | Status: AC
  Administered 2016-07-11: 16:00:00 via INTRAVENOUS

## 2016-07-11 MED ORDER — PACLITAXEL PROTEIN-BOUND CHEMO INJECTION 100 MG
100.0000 mg/m2 | Freq: Once | INTRAVENOUS | Status: AC
  Administered 2016-07-11: 150 mg via INTRAVENOUS
  Filled 2016-07-11: qty 30

## 2016-07-11 MED ORDER — SODIUM CHLORIDE 0.9 % IV SOLN: 800.0000 mg/m2 | Freq: Once | INTRAVENOUS | Status: DC

## 2016-07-11 MED ORDER — SODIUM CHLORIDE 0.9 % IV SOLN
800.0000 mg/m2 | Freq: Once | INTRAVENOUS | Status: AC
  Administered 2016-07-11: 1216 mg via INTRAVENOUS
  Filled 2016-07-11: qty 31.98

## 2016-07-11 MED ORDER — SODIUM CHLORIDE 0.9% FLUSH
10.0000 mL | INTRAVENOUS | Status: DC | PRN
  Administered 2016-07-11: 10 mL
  Filled 2016-07-11: qty 10

## 2016-07-11 MED ORDER — PROCHLORPERAZINE MALEATE 10 MG PO TABS
ORAL_TABLET | ORAL | Status: AC
  Filled 2016-07-11: qty 1

## 2016-07-11 MED ORDER — HEPARIN SOD (PORK) LOCK FLUSH 100 UNIT/ML IV SOLN
500.0000 [IU] | Freq: Once | INTRAVENOUS | Status: AC | PRN
Start: 2016-07-11 — End: 2016-07-11
  Administered 2016-07-11: 500 [IU]
  Filled 2016-07-11: qty 5

## 2016-07-11 NOTE — Progress Notes (Signed)
Oncology Nurse Navigator Documentation  Oncology Nurse Navigator Flowsheets 07/11/2016  Navigator Location [facility]  Navigator Encounter Type Treatment  Telephone -  Abnormal Finding Date -  Confirmed Diagnosis Date -  Treatment Initiated Date 07/11/2016  Patient Visit Type MedOnc  Treatment Phase First Chemo Tx  Barriers/Navigation Needs No barriers at this time;No Questions;No Needs  Education -  Interventions None required  Referrals -  Coordination of Care -  Education Method -  Support Groups/Services -  Acuity Level 1  Time Spent with Patient 15  Meet with patient and her daughter during 1st treatment. Reports the port placement and access went well. Plans on looking for a wig tomorrow if she feels like it. Understands how to take her antiemetic and to call for any adverse side effects from chemo.

## 2016-07-11 NOTE — Patient Instructions (Addendum)
[place] Discharge Instructions for Patients Receiving Chemotherapy  Today you received the following chemotherapy agents:  Abraxane and Gemzar  To help prevent nausea and vomiting after your treatment, we encourage you to take your nausea medication as ordered per MD.   If you develop nausea and vomiting that is not controlled by your nausea medication, call the clinic.   BELOW ARE SYMPTOMS THAT SHOULD BE REPORTED IMMEDIATELY:  *FEVER GREATER THAN 100.5 F  *CHILLS WITH OR WITHOUT FEVER  NAUSEA AND VOMITING THAT IS NOT CONTROLLED WITH YOUR NAUSEA MEDICATION  *UNUSUAL SHORTNESS OF BREATH  *UNUSUAL BRUISING OR BLEEDING  TENDERNESS IN MOUTH AND THROAT WITH OR WITHOUT PRESENCE OF ULCERS  *URINARY PROBLEMS  *BOWEL PROBLEMS  UNUSUAL RASH Items with * indicate a potential emergency and should be followed up as soon as possible.  Feel free to call the clinic you have any questions or concerns. The clinic phone number is [phone].  Please show the [place] at check-in to the Emergency Department and triage nurse.  Gemcitabine injection What is this medicine? GEMCITABINE (jem SIT a been) is a chemotherapy drug. This medicine is used to treat many types of cancer like breast cancer, lung cancer, pancreatic cancer, and ovarian cancer. This medicine may be used for other purposes; ask your health care provider or pharmacist if you have questions. What should I tell my health care provider before I take this medicine? They need to know if you have any of these conditions: -blood disorders -infection -kidney disease -liver disease -recent or ongoing radiation therapy -an unusual or allergic reaction to gemcitabine, other chemotherapy, other medicines, foods, dyes, or preservatives -pregnant or trying to get pregnant -breast-feeding How should I use this medicine? This drug is given as an infusion into a vein. It is administered in a hospital or clinic  by a specially trained health care professional. Talk to your pediatrician regarding the use of this medicine in children. Special care may be needed. Overdosage: If you think you have taken too much of this medicine contact a poison control center or emergency room at once. NOTE: This medicine is only for you. Do not share this medicine with others. What if I miss a dose? It is important not to miss your dose. Call your doctor or health care professional if you are unable to keep an appointment. What may interact with this medicine? -medicines to increase blood counts like filgrastim, pegfilgrastim, sargramostim -some other chemotherapy drugs like cisplatin -vaccines Talk to your doctor or health care professional before taking any of these medicines: -acetaminophen -aspirin -ibuprofen -ketoprofen -naproxen This list may not describe all possible interactions. Give your health care provider a list of all the medicines, herbs, non-prescription drugs, or dietary supplements you use. Also tell them if you smoke, drink alcohol, or use illegal drugs. Some items may interact with your medicine. What should I watch for while using this medicine? Visit your doctor for checks on your progress. This drug may make you feel generally unwell. This is not uncommon, as chemotherapy can affect healthy cells as well as cancer cells. Report any side effects. Continue your course of treatment even though you feel ill unless your doctor tells you to stop. In some cases, you may be given additional medicines to help with side effects. Follow all directions for their use. Call your doctor or health care professional for advice if you get a fever, chills or sore throat, or other symptoms of a cold or  flu. Do not treat yourself. This drug decreases your body's ability to fight infections. Try to avoid being around people who are sick. This medicine may increase your risk to bruise or bleed. Call your doctor or health  care professional if you notice any unusual bleeding. Be careful brushing and flossing your teeth or using a toothpick because you may get an infection or bleed more easily. If you have any dental work done, tell your dentist you are receiving this medicine. Avoid taking products that contain aspirin, acetaminophen, ibuprofen, naproxen, or ketoprofen unless instructed by your doctor. These medicines may hide a fever. Women should inform their doctor if they wish to become pregnant or think they might be pregnant. There is a potential for serious side effects to an unborn child. Talk to your health care professional or pharmacist for more information. Do not breast-feed an infant while taking this medicine. What side effects may I notice from receiving this medicine? Side effects that you should report to your doctor or health care professional as soon as possible: -allergic reactions like skin rash, itching or hives, swelling of the face, lips, or tongue -low blood counts - this medicine may decrease the number of white blood cells, red blood cells and platelets. You may be at increased risk for infections and bleeding. -signs of infection - fever or chills, cough, sore throat, pain or difficulty passing urine -signs of decreased platelets or bleeding - bruising, pinpoint red spots on the skin, black, tarry stools, blood in the urine -signs of decreased red blood cells - unusually weak or tired, fainting spells, lightheadedness -breathing problems -chest pain -mouth sores -nausea and vomiting -pain, swelling, redness at site where injected -pain, tingling, numbness in the hands or feet -stomach pain -swelling of ankles, feet, hands -unusual bleeding Side effects that usually do not require medical attention (report to your doctor or health care professional if they continue or are bothersome): -constipation -diarrhea -hair loss -loss of appetite -stomach upset This list may not describe all  possible side effects. Call your doctor for medical advice about side effects. You may report side effects to FDA at 1-800-FDA-1088. Where should I keep my medicine? This drug is given in a hospital or clinic and will not be stored at home. NOTE: This sheet is a summary. It may not cover all possible information. If you have questions about this medicine, talk to your doctor, pharmacist, or health care provider.    2016, Elsevier/Gold Standard. (2008-03-22 18:45:54)  Nanoparticle Albumin-Bound Paclitaxel injection What is this medicine? NANOPARTICLE ALBUMIN-BOUND PACLITAXEL (Na no PAHR ti kuhl al BYOO muhn-bound PAK li TAX el) is a chemotherapy drug. It targets fast dividing cells, like cancer cells, and causes these cells to die. This medicine is used to treat advanced breast cancer and advanced lung cancer. This medicine may be used for other purposes; ask your health care provider or pharmacist if you have questions. What should I tell my health care provider before I take this medicine? They need to know if you have any of these conditions: -kidney disease -liver disease -low blood counts, like low platelets, red blood cells, or white blood cells -recent or ongoing radiation therapy -an unusual or allergic reaction to paclitaxel, albumin, other chemotherapy, other medicines, foods, dyes, or preservatives -pregnant or trying to get pregnant -breast-feeding How should I use this medicine? This drug is given as an infusion into a vein. It is administered in a hospital or clinic by a specially trained health care  professional. Talk to your pediatrician regarding the use of this medicine in children. Special care may be needed. Overdosage: If you think you have taken too much of this medicine contact a poison control center or emergency room at once. NOTE: This medicine is only for you. Do not share this medicine with others. What if I miss a dose? It is important not to miss your dose.  Call your doctor or health care professional if you are unable to keep an appointment. What may interact with this medicine? -cyclosporine -diazepam -ketoconazole -medicines to increase blood counts like filgrastim, pegfilgrastim, sargramostim -other chemotherapy drugs like cisplatin, doxorubicin, epirubicin, etoposide, teniposide, vincristine -quinidine -testosterone -vaccines -verapamil Talk to your doctor or health care professional before taking any of these medicines: -acetaminophen -aspirin -ibuprofen -ketoprofen -naproxen This list may not describe all possible interactions. Give your health care provider a list of all the medicines, herbs, non-prescription drugs, or dietary supplements you use. Also tell them if you smoke, drink alcohol, or use illegal drugs. Some items may interact with your medicine. What should I watch for while using this medicine? Your condition will be monitored carefully while you are receiving this medicine. You will need important blood work done while you are taking this medicine. This drug may make you feel generally unwell. This is not uncommon, as chemotherapy can affect healthy cells as well as cancer cells. Report any side effects. Continue your course of treatment even though you feel ill unless your doctor tells you to stop. In some cases, you may be given additional medicines to help with side effects. Follow all directions for their use. Call your doctor or health care professional for advice if you get a fever, chills or sore throat, or other symptoms of a cold or flu. Do not treat yourself. This drug decreases your body's ability to fight infections. Try to avoid being around people who are sick. This medicine may increase your risk to bruise or bleed. Call your doctor or health care professional if you notice any unusual bleeding. Be careful brushing and flossing your teeth or using a toothpick because you may get an infection or bleed more  easily. If you have any dental work done, tell your dentist you are receiving this medicine. Avoid taking products that contain aspirin, acetaminophen, ibuprofen, naproxen, or ketoprofen unless instructed by your doctor. These medicines may hide a fever. Do not become pregnant while taking this medicine. Women should inform their doctor if they wish to become pregnant or think they might be pregnant. There is a potential for serious side effects to an unborn child. Talk to your health care professional or pharmacist for more information. Do not breast-feed an infant while taking this medicine. Men are advised not to father a child while receiving this medicine. What side effects may I notice from receiving this medicine? Side effects that you should report to your doctor or health care professional as soon as possible: -allergic reactions like skin rash, itching or hives, swelling of the face, lips, or tongue -low blood counts - This drug may decrease the number of white blood cells, red blood cells and platelets. You may be at increased risk for infections and bleeding. -signs of infection - fever or chills, cough, sore throat, pain or difficulty passing urine -signs of decreased platelets or bleeding - bruising, pinpoint red spots on the skin, black, tarry stools, nosebleeds -signs of decreased red blood cells - unusually weak or tired, fainting spells, lightheadedness -breathing problems -changes in  vision -chest pain -high or low blood pressure -mouth sores -nausea and vomiting -pain, swelling, redness or irritation at the injection site -pain, tingling, numbness in the hands or feet -slow or irregular heartbeat -swelling of the ankle, feet, hands Side effects that usually do not require medical attention (report to your doctor or health care professional if they continue or are bothersome): -aches, pains -changes in the color of fingernails -diarrhea -hair loss -loss of appetite This  list may not describe all possible side effects. Call your doctor for medical advice about side effects. You may report side effects to FDA at 1-800-FDA-1088. Where should I keep my medicine? This drug is given in a hospital or clinic and will not be stored at home. NOTE: This sheet is a summary. It may not cover all possible information. If you have questions about this medicine, talk to your doctor, pharmacist, or health care provider.    2016, Elsevier/Gold Standard. (2013-01-04 16:48:50)

## 2016-07-11 NOTE — Progress Notes (Signed)
81 year old female diagnosed with cancer of the pancreas.  She is a patient of Dr. Julieanne Manson.  Past medical history includes GERD and hyperlipidemia.  Medications include Prilosec and Compazine.  Labs include sodium 133 and albumin 2.8.  Height: 66 inches. Weight: 112.8 pounds on August 8. Usual body weight: 128 pounds August 2016. BMI: 18.21.  Today's patient's first treatment. Complains of poor appetite, early satiety and taste alterations. She also reports she has some nausea. She has tried premier protein and can drink perhaps one daily. Patient is particular about what she eats and nothing sounds good to her.  Nutrition diagnosis:  Unintended weight loss related to inadequate oral intake as evidenced by 15 pound weight loss from usual body weight.  Intervention:  Patient educated to consume small frequent meals and snacks utilizing high-calorie, high-protein foods. Educated patient on importance of finding an oral nutrition supplement that she can tolerate. Educated patient on strategies for taste alterations. Questions were answered.  Teach back method used.  Contact information given.   Patient was given coupons to purchase oral nutrition supplements.  Monitoring, evaluation, goals: Patient will tolerate increased calories and protein to minimize further weight loss.  Next visit: Thursday, August 31 during infusion.  **Disclaimer: This note was dictated with voice recognition software. Similar sounding words can inadvertently be transcribed and this note may contain transcription errors which may not have been corrected upon publication of note.**

## 2016-07-11 NOTE — Progress Notes (Signed)
Faxed completed app & proof of income to the [place].  Discussed the [place] but she exceeds the income requirement.  She has my card for any questions or concerns she may have in the future.

## 2016-07-12 ENCOUNTER — Telehealth: Payer: Self-pay | Admitting: *Deleted

## 2016-07-12 MED ORDER — ONDANSETRON HCL 8 MG PO TABS: 8.0000 mg | ORAL_TABLET | Freq: Three times a day (TID) | ORAL | 1 refills | Status: AC | PRN

## 2016-07-12 NOTE — Telephone Encounter (Signed)
Follow up call placed to pt, unable to reach, lmovm for pt to call office with any questions or concerns.

## 2016-07-12 NOTE — Telephone Encounter (Signed)
Spoke with pt's daughter, temp was 102.3 last night and pt was very disoriented and confused. Daughter reports confusion began before they left infusion room.  Suanne Marker spoke with [facility] on-call service last night and were told to go to ED for fever.  Rhonda kept pt at home, gave Tylenol and fever went down to 98.6. she seems better today cognitively. Temp this AM was 97. Discussed with Dr. Benay Spice: Fever was likely related to Gemzar. Monitor temp, call for signs of infection. Dr. Benay Spice will adjust antiemetic for chemo. Zofran added for home use.  Returned call to [place] with instructions to STOP Compazine. Pt to take Zofran Q8h PRN. She voiced understanding. Stated pt is having difficulty sleeping through the night, asks if there is something she can take to help her rest. Recommended she hold off on sleep aids for next day or two to be sure pt's confusion doesn't recur. Then it is OK to try OTC sleep aid like Tylenol PM or Benadryl. She agreed to do so.

## 2016-07-13 ENCOUNTER — Encounter (HOSPITAL_COMMUNITY): Payer: Self-pay | Admitting: Emergency Medicine

## 2016-07-13 ENCOUNTER — Emergency Department (HOSPITAL_COMMUNITY): Payer: Medicare Other

## 2016-07-13 ENCOUNTER — Emergency Department (HOSPITAL_COMMUNITY)
Admission: EM | Admit: 2016-07-13 | Discharge: 2016-07-13 | Disposition: A | Discharge status: Home / Self Care | Payer: Medicare Other | Attending: Emergency Medicine | Admitting: Emergency Medicine

## 2016-07-13 DIAGNOSIS — R197 Diarrhea, unspecified: Secondary | ICD-10-CM | POA: Diagnosis not present

## 2016-07-13 DIAGNOSIS — Z8507 Personal history of malignant neoplasm of pancreas: Secondary | ICD-10-CM | POA: Diagnosis not present

## 2016-07-13 DIAGNOSIS — Z79899 Other long term (current) drug therapy: Secondary | ICD-10-CM | POA: Insufficient documentation

## 2016-07-13 DIAGNOSIS — R509 Fever, unspecified: Secondary | ICD-10-CM | POA: Insufficient documentation

## 2016-07-13 HISTORY — DX: Malignant (primary) neoplasm, unspecified: C80.1

## 2016-07-13 LAB — COMPREHENSIVE METABOLIC PANEL
ALBUMIN: 2.6 g/dL — AB (ref 3.5–5.0)
ALT: 38 U/L (ref 14–54)
AST: 38 U/L (ref 15–41)
Alkaline Phosphatase: 81 U/L (ref 38–126)
Anion gap: 8 (ref 5–15)
BILIRUBIN TOTAL: 1 mg/dL (ref 0.3–1.2)
BUN: 18 mg/dL (ref 6–20)
CALCIUM: 8.2 mg/dL — AB (ref 8.9–10.3)
CO2: 27 mmol/L (ref 22–32)
CREATININE: 0.54 mg/dL (ref 0.44–1.00)
Chloride: 97 mmol/L — ABNORMAL LOW (ref 101–111)
GFR calc Af Amer: >60 mL/min (ref >60)
GFR calc non Af Amer: >60 mL/min (ref >60)
GLUCOSE: 108 mg/dL — AB (ref 65–99)
Potassium: 3.1 mmol/L — ABNORMAL LOW (ref 3.5–5.1)
SODIUM: 132 mmol/L — AB (ref 135–145)
Total Protein: 5.8 g/dL — ABNORMAL LOW (ref 6.5–8.1)

## 2016-07-13 LAB — CBC WITH DIFFERENTIAL/PLATELET
BASOS PCT: 0 %
Basophils Absolute: 0 10*3/uL (ref 0.0–0.1)
Eosinophils Absolute: 0.1 10*3/uL (ref 0.0–0.7)
Eosinophils Relative: 0 %
HEMATOCRIT: 24.7 % — AB (ref 36.0–46.0)
HEMOGLOBIN: 8.6 g/dL — AB (ref 12.0–15.0)
Lymphocytes Relative: 5 %
Lymphs Abs: 0.9 10*3/uL (ref 0.7–4.0)
MCH: 30.4 pg (ref 26.0–34.0)
MCHC: 34.8 g/dL (ref 30.0–36.0)
MCV: 87.3 fL (ref 78.0–100.0)
MONOS PCT: 1 %
Monocytes Absolute: 0.1 10*3/uL (ref 0.1–1.0)
Neutro Abs: 18.2 10*3/uL — ABNORMAL HIGH (ref 1.7–7.7)
Neutrophils Relative %: 94 %
Platelets: 225 10*3/uL (ref 150–400)
RBC: 2.83 MIL/uL — AB (ref 3.87–5.11)
RDW: 13.5 % (ref 11.5–15.5)
WBC: 19.2 10*3/uL — ABNORMAL HIGH (ref 4.0–10.5)

## 2016-07-13 LAB — URINALYSIS, ROUTINE W REFLEX MICROSCOPIC
BILIRUBIN URINE: NEGATIVE
GLUCOSE, UA: NEGATIVE mg/dL
KETONES UR: NEGATIVE mg/dL
Leukocytes, UA: NEGATIVE
Nitrite: NEGATIVE
PH: 6 (ref 5.0–8.0)
Protein, ur: NEGATIVE mg/dL
Specific Gravity, Urine: 1.013 (ref 1.005–1.030)

## 2016-07-13 LAB — URINE MICROSCOPIC-ADD ON

## 2016-07-13 MED ORDER — HEPARIN SOD (PORK) LOCK FLUSH 100 UNIT/ML IV SOLN
500.0000 [IU] | Freq: Once | INTRAVENOUS | Status: AC
  Administered 2016-07-13: 500 [IU]
  Filled 2016-07-13: qty 5

## 2016-07-13 NOTE — ED Provider Notes (Signed)
[facility] DEPT Provider Note   CSN: [id number] Arrival date & time: 07/13/16  1543     History   Chief Complaint No chief complaint on file.   HPI Crystal Small is a 81 y.o. female.  The history is provided by the patient.  Fever   This is a new problem. Episode onset: 4 days. Episode frequency: fluctuating. The problem has not changed since onset.Maximum temperature: 102.3 yesterday; 101.6 today. Associated symptoms include diarrhea (for 3 days; intermittent). Pertinent negatives include no chest pain, no fussiness, no sleepiness, no vomiting, no congestion, no headaches, no sore throat, no tugging at ear and no cough. She has tried acetaminophen for the symptoms. The treatment provided moderate relief.   No known sick contact.   Pain at the insertion site for port-a-cath that was placed on Wed is improving. No drainage or redness at the site.  Past Medical History:  Diagnosis Date  . Cancer [facility])    pancreatic cancer   . GERD (gastroesophageal reflux disease)   . Hemorrhoids   . Hyperlipidemia   . Pancreatic mass     Patient Active Problem List   Diagnosis Date Noted  . Cancer of pancreas, body ([place]) 07/02/2016    Past Surgical History:  Procedure Laterality Date  . ABDOMINAL HYSTERECTOMY  1990   COMPLETE  . COLONOSCOPY  2006  . EUS N/A 06/27/2016   Procedure: ESOPHAGEAL ENDOSCOPIC ULTRASOUND (EUS) RADIAL;  Surgeon: Milus Banister, MD;  Location: [facility] ENDOSCOPY;  Service: Endoscopy;  Laterality: N/A;  . IR GENERIC HISTORICAL  07/10/2016   IR US GUIDE VASC ACCESS RIGHT 07/10/2016 Jacqulynn Cadet, MD [facility]-INTER[facility]  . IR GENERIC HISTORICAL  07/10/2016   IR FLUORO GUIDE CV LINE RIGHT 07/10/2016 Jacqulynn Cadet, MD [facility]  . LIVER BIOPSY  06/19/2016   WITH ULTRASOUND  . PORTACATH PLACEMENT      OB History    No data available       Home Medications    Prior to Admission medications   Medication Sig Start Date End Date Taking? Authorizing Provider    acetaminophen (TYLENOL) 325 MG tablet Take 325 mg by mouth every 6 (six) hours as needed for fever.   Yes Historical Provider, MD  HYDROcodone-acetaminophen (NORCO/VICODIN) 5-325 MG tablet Take 1 tablet by mouth every 6 (six) hours as needed for moderate pain.   Yes Historical Provider, MD  lidocaine-prilocaine (EMLA) cream Apply 1 application topically as needed (prior to accessing port).   Yes Historical Provider, MD  omeprazole (PRILOSEC) 40 MG capsule Take 40 mg by mouth daily.    Yes Historical Provider, MD  ondansetron (ZOFRAN) 8 MG tablet Take 1 tablet (8 mg total) by mouth every 8 (eight) hours as needed for nausea or vomiting. 07/12/16  Yes Ladell Pier, MD    Family History Family History  Problem Relation Age of Onset  . Heart disease Sister   . Heart disease Brother   . Lung cancer Brother     Social History Social History  Substance Use Topics  . Smoking status: Never Smoker  . Smokeless tobacco: Never Used  . Alcohol use No     Allergies   Review of patient's allergies indicates no known allergies.   Review of Systems Review of Systems  Constitutional: Positive for fever.  HENT: Negative for congestion and sore throat.   Respiratory: Negative for cough and shortness of breath.   Cardiovascular: Negative for chest pain.  Gastrointestinal: Positive for diarrhea (for 3 days; intermittent). Negative  for nausea and vomiting.  Genitourinary: Negative for difficulty urinating, dysuria and flank pain.  Neurological: Negative for headaches.  All other systems reviewed and are negative.    Physical Exam Updated Vital Signs BP 126/58 (BP Location: Left Arm)   Pulse 91   Temp 98.8 F (37.1 C) (Oral)   Resp 20   Ht 5\' 5"  (1.651 m)   Wt 112 lb (50.8 kg)   SpO2 94%   BMI 18.64 kg/m   Physical Exam  Constitutional: She is oriented to person, place, and time. She appears well-developed and well-nourished. No distress.  HENT:  Head: Normocephalic and  atraumatic.  Right Ear: External ear normal.  Left Ear: External ear normal.  Nose: Nose normal.  Eyes: Conjunctivae and EOM are normal. Pupils are equal, round, and reactive to light. Right eye exhibits no discharge. Left eye exhibits no discharge. No scleral icterus.  Neck: Normal range of motion. Neck supple.  Cardiovascular: Normal rate, regular rhythm and normal heart sounds.  Exam reveals no gallop and no friction rub.   No murmur heard. Pulmonary/Chest: Effort normal and breath sounds normal. No stridor. No respiratory distress. She has no wheezes.    Abdominal: Soft. She exhibits no distension. There is no tenderness.  Musculoskeletal: She exhibits no edema or tenderness.  Neurological: She is alert and oriented to person, place, and time.  Skin: Skin is warm and dry. No rash noted. She is not diaphoretic. No erythema.  Psychiatric: She has a normal mood and affect.     ED Treatments / Results  Labs (all labs ordered are listed, but only abnormal results are displayed) Labs Reviewed  COMPREHENSIVE METABOLIC PANEL - Abnormal; Notable for the following:       Result Value   Sodium 132 (*)    Potassium 3.1 (*)    Chloride 97 (*)    Glucose, Bld 108 (*)    Calcium 8.2 (*)    Total Protein 5.8 (*)    Albumin 2.6 (*)    All other components within normal limits  URINALYSIS, ROUTINE W REFLEX MICROSCOPIC (NOT AT [facility]) - Abnormal; Notable for the following:    Hgb urine dipstick SMALL (*)    All other components within normal limits  CBC WITH DIFFERENTIAL/PLATELET - Abnormal; Notable for the following:    WBC 19.2 (*)    RBC 2.83 (*)    Hemoglobin 8.6 (*)    HCT 24.7 (*)    Neutro Abs 18.2 (*)    All other components within normal limits  URINE MICROSCOPIC-ADD ON - Abnormal; Notable for the following:    Squamous Epithelial / LPF 6-30 (*)    Bacteria, UA RARE (*)    All other components within normal limits  URINE CULTURE  CULTURE, BLOOD (ROUTINE X 2)  CULTURE, BLOOD  (ROUTINE X 2)    EKG  EKG Interpretation None       Radiology Dg Chest 2 View  Result Date: 07/13/2016 CLINICAL DATA:  Confusion, history of pancreatic carcinoma with chemotherapy treatment EXAM: CHEST  2 VIEW COMPARISON:  07/10/2016 FINDINGS: Right chest wall port is again seen and stable. Cardiac shadow is within normal limits. The lungs are well aerated without focal infiltrate or sizable effusion. Stable compression deformity of T12 is noted. No acute bony abnormality is seen. IMPRESSION: No active cardiopulmonary disease. Electronically Signed   By: Inez Catalina M.D.   On: 07/13/2016 17:00    Procedures Procedures (including critical care time)  Medications Ordered in ED  Medications - No data to display   Initial Impression / Assessment and Plan / ED Course  I have reviewed the triage vital signs and the nursing notes.  Pertinent labs & imaging results that were available during my care of the patient were reviewed by me and considered in my medical decision making (see chart for details).  Clinical Course    Port-A-Cath site without evidence of infection. CT chest without evidence of pneumonia. UA without evidence of infection. CBC with stable white blood cell count. Hemoglobin decreased from 3 days ago. Cell counts are stable. No significant electrolyte derangements.   Obtain blood cultures peripherally 2 and one from the Port-A-Cath site. I spoke with Dr. Benay Spice, and informed them of the negative workup. Since patient's is stable and looks well and feel that she is safe for discharge with close follow-up with oncology. Dr. Benay Spice was in agreement and mentioned that the fevers may be related to the recent chemotherapy dose.   Patient made aware of the results and plan. The patient and family both in agreement. She is safe for discharge with close follow-up.    Final Clinical Impressions(s) / ED Diagnoses   Final diagnoses:  Fever, unspecified fever cause    Disposition: Discharge  Condition: Good  I have discussed the results, Dx and Tx plan with the patient who expressed understanding and agree(s) with the plan. Discharge instructions discussed at great length. The patient was given strict return precautions who verbalized understanding of the instructions. No further questions at time of discharge.    Current Discharge Medication List      Follow Up: Haywood Pao, MD [place] [id number] [phone]  Call  As needed  [place], [place] [address] [phone]  Call  If symptoms do not improve or  worsen      Fatima [facility], MD 07/13/16 2015

## 2016-07-13 NOTE — ED Triage Notes (Addendum)
Pt had had her port placed on Wed and her first chemo on Thursday for pancreatic cancer. Pt states that she has had fevers on off since then. Pt states that she has had diarrhea today, but denies emesis. Pt states she gets a fever, sweats, and then the fever goes away. Pt's daughter states that when pt is febrile, she is confused (she thought it was Christmas last night). Pt states that nights are worse for her than days. Pt's daughter states that pt is at base line a this time. Pt is alert and oriented x 4. Pt is not febrile and is not tachycardic

## 2016-07-15 ENCOUNTER — Other Ambulatory Visit: Payer: Self-pay | Admitting: *Deleted

## 2016-07-15 ENCOUNTER — Telehealth: Payer: Self-pay | Admitting: *Deleted

## 2016-07-15 LAB — URINE CULTURE

## 2016-07-15 MED ORDER — PREDNISONE 10 MG (21) PO TBPK: 10.0000 mg | ORAL_TABLET | Freq: Every day | ORAL | 0 refills | Status: DC

## 2016-07-15 NOTE — Telephone Encounter (Signed)
Message received from pt.'s daughter Suanne Marker stating that pt has no appetite and feels full after eating.  Pt asking if she needs a feeding tube.  Pt denies any nausea, vomiting, diarrhea or further fever.  Per Dr. Benay Spice, pt may take Prednisone 10 mg daily to increase appetite.  Pt.'s daughter Suanne Marker notified and prescription sent to [facility] on [address].

## 2016-07-15 NOTE — Telephone Encounter (Signed)
VM left from daughter reporting visit to ER over weekend due to fever (had Gemzar). Still has some nausea, not eating, feels "full". Asking about a "neulasta patch or B12". Forwarded the message to collaborative nurse voice mail.

## 2016-07-16 ENCOUNTER — Telehealth: Payer: Self-pay

## 2016-07-16 ENCOUNTER — Other Ambulatory Visit: Payer: Self-pay | Admitting: *Deleted

## 2016-07-16 ENCOUNTER — Telehealth (HOSPITAL_COMMUNITY): Payer: Self-pay

## 2016-07-16 MED ORDER — AMOXICILLIN 500 MG PO CAPS: 500.0000 mg | ORAL_CAPSULE | Freq: Three times a day (TID) | ORAL | 0 refills | Status: DC

## 2016-07-16 MED ORDER — HYDROCODONE-ACETAMINOPHEN 5-325 MG PO TABS: 1.0000 | ORAL_TABLET | Freq: Four times a day (QID) | ORAL | 0 refills | Status: DC | PRN

## 2016-07-16 NOTE — Telephone Encounter (Signed)
Daughter got a call from ED. The urine culture ED performed came back positive. ED called daughter and told her to either come to ED or have Dr Benay Spice order an antibiotic. She is calling for an antibiotic.

## 2016-07-16 NOTE — Telephone Encounter (Signed)
Post ED Visit - Positive Culture Follow-up  Culture report reviewed by antimicrobial stewardship pharmacist:  []  Elenor Quinones, Pharm.D. []  Heide Guile, Pharm.D., BCPS []  Parks Neptune, Pharm.D. []  Alycia Rossetti, Pharm.D., BCPS []  [place], Pharm.D., BCPS, AAHIVP []  Legrand Como, Pharm.D., BCPS, AAHIVP []  Milus Glazier, Pharm.D. []  Stephens November, Pharm.DDyanne Carrel, Pharm D  Positive urine culture, 70,000 colonies -> Enterococcus Faecalis  Chart reviewed by Adolm Joseph PA  "Check on pt.  If still fevers, go to ER or PCP"  07/16/2016 @ 14:18 Spoke w/pt.  Pt states no Temp over 100 degrees but is taking tylenol regularly.  Advised pt of pla nto f/u w/PCP or ER if running fevers.  Dortha Kern 07/16/2016, 2:41 PM

## 2016-07-16 NOTE — Telephone Encounter (Signed)
Dr. Benay Spice notified of positive urine culture and order received to send in prescription for Amoxicillin 500 mg TID for 5 days.  Pt.'s daughter notified.

## 2016-07-18 ENCOUNTER — Encounter: Payer: Self-pay | Admitting: Oncology

## 2016-07-18 LAB — CULTURE, BLOOD (ROUTINE X 2)
CULTURE: NO GROWTH
Culture: NO GROWTH

## 2016-07-18 NOTE — Progress Notes (Signed)
Called the [employer] to check status on application.  Pt was denied, she exceeded their income requirement.  Left msg for pt to return my call to inform her of their decision.

## 2016-07-22 ENCOUNTER — Telehealth: Payer: Self-pay | Admitting: *Deleted

## 2016-07-22 ENCOUNTER — Encounter: Payer: Self-pay | Admitting: Oncology

## 2016-07-22 NOTE — Progress Notes (Signed)
Pt hasn't returned my call so I called her daughter Suanne Marker informing her that the [employer] denied her moms application for copay assistance because she exceeded their income requirement and they mailed her a letter on 07/17/16 of this decision.  She verbalized understanding and thanked me for trying.

## 2016-07-22 NOTE — Telephone Encounter (Signed)
Oncology Nurse Navigator Documentation  Oncology Nurse Navigator Flowsheets 07/22/2016  Navigator Location [facility]  Navigator Encounter Type Telephone  Telephone Incoming Call;Symptom Mgt;Financial Assistance  Abnormal Finding Date -  Confirmed Diagnosis Date -  Treatment Initiated Date -  Patient Visit Type -  Treatment Phase Active Tx  Barriers/Navigation Needs -  Education -  Interventions Other--note to MD requesting letter  Referrals -  Coordination of Care -  Education Method -  Support Groups/Services -  Acuity Level 1  Time Spent with Patient 15  Daughter asking if Reanne could have script sent in for low dose Xanax to help with anxiety and help her sleep at night. If OK, please send to [facility]. Also need MD to dictate letter that due to her cancer diagnosis and treatment she has required assistance in home with cooking, cleaning and driving to and from appointments. Would like letter to be written that since 06/25/16 this has been necessary for her. Has an insurance policy that will cover some of this care. They would like to get the letter on 07/25/16 appointment.

## 2016-07-25 ENCOUNTER — Telehealth: Payer: Self-pay | Admitting: Oncology

## 2016-07-25 ENCOUNTER — Ambulatory Visit (HOSPITAL_BASED_OUTPATIENT_CLINIC_OR_DEPARTMENT_OTHER): Payer: Medicare Other | Admitting: Nurse Practitioner

## 2016-07-25 ENCOUNTER — Ambulatory Visit: Payer: Medicare Other

## 2016-07-25 ENCOUNTER — Ambulatory Visit (HOSPITAL_BASED_OUTPATIENT_CLINIC_OR_DEPARTMENT_OTHER): Payer: Medicare Other

## 2016-07-25 ENCOUNTER — Other Ambulatory Visit (HOSPITAL_BASED_OUTPATIENT_CLINIC_OR_DEPARTMENT_OTHER): Payer: Medicare Other

## 2016-07-25 ENCOUNTER — Ambulatory Visit (HOSPITAL_BASED_OUTPATIENT_CLINIC_OR_DEPARTMENT_OTHER): Payer: Medicare Other | Admitting: Nutrition

## 2016-07-25 VITALS — BP 122/51 | HR 81 | Temp 98.8°F | Resp 18 | Ht 65.0 in | Wt 110.1 lb

## 2016-07-25 DIAGNOSIS — C787 Secondary malignant neoplasm of liver and intrahepatic bile duct: Secondary | ICD-10-CM | POA: Diagnosis not present

## 2016-07-25 DIAGNOSIS — R109 Unspecified abdominal pain: Secondary | ICD-10-CM

## 2016-07-25 DIAGNOSIS — R634 Abnormal weight loss: Secondary | ICD-10-CM | POA: Diagnosis not present

## 2016-07-25 DIAGNOSIS — C251 Malignant neoplasm of body of pancreas: Secondary | ICD-10-CM

## 2016-07-25 DIAGNOSIS — R630 Anorexia: Secondary | ICD-10-CM

## 2016-07-25 DIAGNOSIS — Z5111 Encounter for antineoplastic chemotherapy: Secondary | ICD-10-CM | POA: Diagnosis not present

## 2016-07-25 DIAGNOSIS — Z95828 Presence of other vascular implants and grafts: Secondary | ICD-10-CM

## 2016-07-25 LAB — COMPREHENSIVE METABOLIC PANEL
ALT: 64 U/L — ABNORMAL HIGH (ref 0–55)
AST: 34 U/L (ref 5–34)
Albumin: 2.6 g/dL — ABNORMAL LOW (ref 3.5–5.0)
Alkaline Phosphatase: 92 U/L (ref 40–150)
Anion Gap: 8 mEq/L (ref 3–11)
BUN: 19.3 mg/dL (ref 7.0–26.0)
CO2: 25 meq/L (ref 22–29)
Calcium: 7.8 mg/dL — ABNORMAL LOW (ref 8.4–10.4)
Chloride: 101 mEq/L (ref 98–109)
Creatinine: 0.6 mg/dL (ref 0.6–1.1)
EGFR: 88 mL/min/{1.73_m2} — AB (ref >90)
GLUCOSE: 123 mg/dL (ref 70–140)
POTASSIUM: 3.5 meq/L (ref 3.5–5.1)
SODIUM: 133 meq/L — AB (ref 136–145)
TOTAL PROTEIN: 5.4 g/dL — AB (ref 6.4–8.3)
Total Bilirubin: 0.55 mg/dL (ref 0.20–1.20)

## 2016-07-25 LAB — CBC WITH DIFFERENTIAL/PLATELET
BASO%: 0.8 % (ref 0.0–2.0)
Basophils Absolute: 0.1 10*3/uL (ref 0.0–0.1)
EOS ABS: 0.1 10*3/uL (ref 0.0–0.5)
EOS%: 1.1 % (ref 0.0–7.0)
HCT: 30 % — ABNORMAL LOW (ref 34.8–46.6)
HGB: 9.7 g/dL — ABNORMAL LOW (ref 11.6–15.9)
LYMPH%: 16.9 % (ref 14.0–49.7)
MCH: 29.4 pg (ref 25.1–34.0)
MCHC: 32.3 g/dL (ref 31.5–36.0)
MCV: 91 fL (ref 79.5–101.0)
MONO#: 1.6 10*3/uL — ABNORMAL HIGH (ref 0.1–0.9)
MONO%: 18.6 % — AB (ref 0.0–14.0)
NEUT%: 62.6 % (ref 38.4–76.8)
NEUTROS ABS: 5.3 10*3/uL (ref 1.5–6.5)
Platelets: 303 10*3/uL (ref 145–400)
RBC: 3.3 10*6/uL — AB (ref 3.70–5.45)
RDW: 14.9 % — ABNORMAL HIGH (ref 11.2–14.5)
WBC: 8.5 10*3/uL (ref 3.9–10.3)
lymph#: 1.4 10*3/uL (ref 0.9–3.3)

## 2016-07-25 LAB — TECHNOLOGIST REVIEW

## 2016-07-25 MED ORDER — ONDANSETRON HCL 8 MG PO TABS
ORAL_TABLET | ORAL | Status: AC
  Filled 2016-07-25: qty 1

## 2016-07-25 MED ORDER — SODIUM CHLORIDE 0.9 % IV SOLN
Freq: Once | INTRAVENOUS | Status: AC
  Administered 2016-07-25: 15:00:00 via INTRAVENOUS

## 2016-07-25 MED ORDER — ONDANSETRON HCL 8 MG PO TABS
8.0000 mg | ORAL_TABLET | Freq: Once | ORAL | Status: AC
  Administered 2016-07-25: 8 mg via ORAL

## 2016-07-25 MED ORDER — PACLITAXEL PROTEIN-BOUND CHEMO INJECTION 100 MG
100.0000 mg/m2 | Freq: Once | INTRAVENOUS | Status: AC
  Administered 2016-07-25: 150 mg via INTRAVENOUS
  Filled 2016-07-25: qty 30

## 2016-07-25 MED ORDER — SODIUM CHLORIDE 0.9% FLUSH
10.0000 mL | INTRAVENOUS | Status: DC | PRN
  Administered 2016-07-25: 10 mL
  Filled 2016-07-25: qty 10

## 2016-07-25 MED ORDER — SODIUM CHLORIDE 0.9 % IV SOLN
800.0000 mg/m2 | Freq: Once | INTRAVENOUS | Status: AC
  Administered 2016-07-25: 1216 mg via INTRAVENOUS
  Filled 2016-07-25: qty 31.98

## 2016-07-25 MED ORDER — HEPARIN SOD (PORK) LOCK FLUSH 100 UNIT/ML IV SOLN
500.0000 [IU] | Freq: Once | INTRAVENOUS | Status: AC | PRN
  Administered 2016-07-25: 500 [IU]
  Filled 2016-07-25: qty 5

## 2016-07-25 MED ORDER — SODIUM CHLORIDE 0.9 % IJ SOLN
10.0000 mL | INTRAMUSCULAR | Status: DC | PRN
  Administered 2016-07-25: 10 mL via INTRAVENOUS
  Filled 2016-07-25: qty 10

## 2016-07-25 MED ORDER — HYDROCODONE-ACETAMINOPHEN 5-325 MG PO TABS: 1.0000 | ORAL_TABLET | Freq: Four times a day (QID) | ORAL | 0 refills | Status: DC | PRN

## 2016-07-25 NOTE — Patient Instructions (Signed)
[facility] [place] Discharge Instructions for Patients Receiving Chemotherapy  Today you received the following chemotherapy agents:  Abraxane and Gemzar  To help prevent nausea and vomiting after your treatment, we encourage you to take your nausea medication as ordered per MD.   If you develop nausea and vomiting that is not controlled by your nausea medication, call the clinic.   BELOW ARE SYMPTOMS THAT SHOULD BE REPORTED IMMEDIATELY:  *FEVER GREATER THAN 100.5 F  *CHILLS WITH OR WITHOUT FEVER  NAUSEA AND VOMITING THAT IS NOT CONTROLLED WITH YOUR NAUSEA MEDICATION  *UNUSUAL SHORTNESS OF BREATH  *UNUSUAL BRUISING OR BLEEDING  TENDERNESS IN MOUTH AND THROAT WITH OR WITHOUT PRESENCE OF ULCERS  *URINARY PROBLEMS  *BOWEL PROBLEMS  UNUSUAL RASH Items with * indicate a potential emergency and should be followed up as soon as possible.  Feel free to call the clinic you have any questions or concerns. The clinic phone number is [phone].  Please show the [place] at check-in to the Emergency Department and triage nurse.  Gemcitabine injection What is this medicine? GEMCITABINE (jem SIT a been) is a chemotherapy drug. This medicine is used to treat many types of cancer like breast cancer, lung cancer, pancreatic cancer, and ovarian cancer. This medicine may be used for other purposes; ask your health care provider or pharmacist if you have questions. What should I tell my health care provider before I take this medicine? They need to know if you have any of these conditions: -blood disorders -infection -kidney disease -liver disease -recent or ongoing radiation therapy -an unusual or allergic reaction to gemcitabine, other chemotherapy, other medicines, foods, dyes, or preservatives -pregnant or trying to get pregnant -breast-feeding How should I use this medicine? This drug is given as an infusion into a vein. It is administered in a hospital or clinic  by a specially trained health care professional. Talk to your pediatrician regarding the use of this medicine in children. Special care may be needed. Overdosage: If you think you have taken too much of this medicine contact a poison control center or emergency room at once. NOTE: This medicine is only for you. Do not share this medicine with others. What if I miss a dose? It is important not to miss your dose. Call your doctor or health care professional if you are unable to keep an appointment. What may interact with this medicine? -medicines to increase blood counts like filgrastim, pegfilgrastim, sargramostim -some other chemotherapy drugs like cisplatin -vaccines Talk to your doctor or health care professional before taking any of these medicines: -acetaminophen -aspirin -ibuprofen -ketoprofen -naproxen This list may not describe all possible interactions. Give your health care provider a list of all the medicines, herbs, non-prescription drugs, or dietary supplements you use. Also tell them if you smoke, drink alcohol, or use illegal drugs. Some items may interact with your medicine. What should I watch for while using this medicine? Visit your doctor for checks on your progress. This drug may make you feel generally unwell. This is not uncommon, as chemotherapy can affect healthy cells as well as cancer cells. Report any side effects. Continue your course of treatment even though you feel ill unless your doctor tells you to stop. In some cases, you may be given additional medicines to help with side effects. Follow all directions for their use. Call your doctor or health care professional for advice if you get a fever, chills or sore throat, or other symptoms of a cold or  flu. Do not treat yourself. This drug decreases your body's ability to fight infections. Try to avoid being around people who are sick. This medicine may increase your risk to bruise or bleed. Call your doctor or health  care professional if you notice any unusual bleeding. Be careful brushing and flossing your teeth or using a toothpick because you may get an infection or bleed more easily. If you have any dental work done, tell your dentist you are receiving this medicine. Avoid taking products that contain aspirin, acetaminophen, ibuprofen, naproxen, or ketoprofen unless instructed by your doctor. These medicines may hide a fever. Women should inform their doctor if they wish to become pregnant or think they might be pregnant. There is a potential for serious side effects to an unborn child. Talk to your health care professional or pharmacist for more information. Do not breast-feed an infant while taking this medicine. What side effects may I notice from receiving this medicine? Side effects that you should report to your doctor or health care professional as soon as possible: -allergic reactions like skin rash, itching or hives, swelling of the face, lips, or tongue -low blood counts - this medicine may decrease the number of white blood cells, red blood cells and platelets. You may be at increased risk for infections and bleeding. -signs of infection - fever or chills, cough, sore throat, pain or difficulty passing urine -signs of decreased platelets or bleeding - bruising, pinpoint red spots on the skin, black, tarry stools, blood in the urine -signs of decreased red blood cells - unusually weak or tired, fainting spells, lightheadedness -breathing problems -chest pain -mouth sores -nausea and vomiting -pain, swelling, redness at site where injected -pain, tingling, numbness in the hands or feet -stomach pain -swelling of ankles, feet, hands -unusual bleeding Side effects that usually do not require medical attention (report to your doctor or health care professional if they continue or are bothersome): -constipation -diarrhea -hair loss -loss of appetite -stomach upset This list may not describe all  possible side effects. Call your doctor for medical advice about side effects. You may report side effects to FDA at 1-800-FDA-1088. Where should I keep my medicine? This drug is given in a hospital or clinic and will not be stored at home. NOTE: This sheet is a summary. It may not cover all possible information. If you have questions about this medicine, talk to your doctor, pharmacist, or health care provider.    2016, Elsevier/Gold Standard. (2008-03-22 18:45:54)  Nanoparticle Albumin-Bound Paclitaxel injection What is this medicine? NANOPARTICLE ALBUMIN-BOUND PACLITAXEL (Na no PAHR ti kuhl al BYOO muhn-bound PAK li TAX el) is a chemotherapy drug. It targets fast dividing cells, like cancer cells, and causes these cells to die. This medicine is used to treat advanced breast cancer and advanced lung cancer. This medicine may be used for other purposes; ask your health care provider or pharmacist if you have questions. What should I tell my health care provider before I take this medicine? They need to know if you have any of these conditions: -kidney disease -liver disease -low blood counts, like low platelets, red blood cells, or white blood cells -recent or ongoing radiation therapy -an unusual or allergic reaction to paclitaxel, albumin, other chemotherapy, other medicines, foods, dyes, or preservatives -pregnant or trying to get pregnant -breast-feeding How should I use this medicine? This drug is given as an infusion into a vein. It is administered in a hospital or clinic by a specially trained health care  professional. Talk to your pediatrician regarding the use of this medicine in children. Special care may be needed. Overdosage: If you think you have taken too much of this medicine contact a poison control center or emergency room at once. NOTE: This medicine is only for you. Do not share this medicine with others. What if I miss a dose? It is important not to miss your dose.  Call your doctor or health care professional if you are unable to keep an appointment. What may interact with this medicine? -cyclosporine -diazepam -ketoconazole -medicines to increase blood counts like filgrastim, pegfilgrastim, sargramostim -other chemotherapy drugs like cisplatin, doxorubicin, epirubicin, etoposide, teniposide, vincristine -quinidine -testosterone -vaccines -verapamil Talk to your doctor or health care professional before taking any of these medicines: -acetaminophen -aspirin -ibuprofen -ketoprofen -naproxen This list may not describe all possible interactions. Give your health care provider a list of all the medicines, herbs, non-prescription drugs, or dietary supplements you use. Also tell them if you smoke, drink alcohol, or use illegal drugs. Some items may interact with your medicine. What should I watch for while using this medicine? Your condition will be monitored carefully while you are receiving this medicine. You will need important blood work done while you are taking this medicine. This drug may make you feel generally unwell. This is not uncommon, as chemotherapy can affect healthy cells as well as cancer cells. Report any side effects. Continue your course of treatment even though you feel ill unless your doctor tells you to stop. In some cases, you may be given additional medicines to help with side effects. Follow all directions for their use. Call your doctor or health care professional for advice if you get a fever, chills or sore throat, or other symptoms of a cold or flu. Do not treat yourself. This drug decreases your body's ability to fight infections. Try to avoid being around people who are sick. This medicine may increase your risk to bruise or bleed. Call your doctor or health care professional if you notice any unusual bleeding. Be careful brushing and flossing your teeth or using a toothpick because you may get an infection or bleed more  easily. If you have any dental work done, tell your dentist you are receiving this medicine. Avoid taking products that contain aspirin, acetaminophen, ibuprofen, naproxen, or ketoprofen unless instructed by your doctor. These medicines may hide a fever. Do not become pregnant while taking this medicine. Women should inform their doctor if they wish to become pregnant or think they might be pregnant. There is a potential for serious side effects to an unborn child. Talk to your health care professional or pharmacist for more information. Do not breast-feed an infant while taking this medicine. Men are advised not to father a child while receiving this medicine. What side effects may I notice from receiving this medicine? Side effects that you should report to your doctor or health care professional as soon as possible: -allergic reactions like skin rash, itching or hives, swelling of the face, lips, or tongue -low blood counts - This drug may decrease the number of white blood cells, red blood cells and platelets. You may be at increased risk for infections and bleeding. -signs of infection - fever or chills, cough, sore throat, pain or difficulty passing urine -signs of decreased platelets or bleeding - bruising, pinpoint red spots on the skin, black, tarry stools, nosebleeds -signs of decreased red blood cells - unusually weak or tired, fainting spells, lightheadedness -breathing problems -changes in  vision -chest pain -high or low blood pressure -mouth sores -nausea and vomiting -pain, swelling, redness or irritation at the injection site -pain, tingling, numbness in the hands or feet -slow or irregular heartbeat -swelling of the ankle, feet, hands Side effects that usually do not require medical attention (report to your doctor or health care professional if they continue or are bothersome): -aches, pains -changes in the color of fingernails -diarrhea -hair loss -loss of appetite This  list may not describe all possible side effects. Call your doctor for medical advice about side effects. You may report side effects to FDA at 1-800-FDA-1088. Where should I keep my medicine? This drug is given in a hospital or clinic and will not be stored at home. NOTE: This sheet is a summary. It may not cover all possible information. If you have questions about this medicine, talk to your doctor, pharmacist, or health care provider.    2016, Elsevier/Gold Standard. (2013-01-04 16:48:50)

## 2016-07-25 NOTE — Progress Notes (Signed)
  [facility] OFFICE PROGRESS NOTE   Diagnosis:  Pancreas cancer  INTERVAL HISTORY:   Crystal Small returns as scheduled. She completed cycle 1 gemcitabine/Abraxane 07/11/2016. She denies nausea/vomiting. No mouth sores. She had a few loose stools. No numbness or tingling in her hands or feet. No rash. She was seen in the emergency department with "fever and confusion" on 07/13/2016. A urine culture showed enterococcus. She completed a course of amoxicillin. She is having difficulty staying asleep. She tried her daughters Xanax and this did not help. She denies pain at present.  Objective:  Vital signs in last 24 hours:  Blood pressure (!) 122/51, pulse 81, temperature 98.8 F (37.1 C), temperature source Oral, resp. rate 18, height 5\' 5"  (1.651 m), weight 110 lb 1.6 oz (49.9 kg), SpO2 99 %.    HEENT: No thrush or ulcers. Resp: Lungs clear bilaterally. Cardio: Regular rate and rhythm. GI: Abdomen soft and nontender. No hepatomegaly. No mass. Vascular: No leg edema. Port-A-Cath without erythema.  Lab Results:  Lab Results  Component Value Date   WBC 8.5 07/25/2016   HGB 9.7 (L) 07/25/2016   HCT 30.0 (L) 07/25/2016   MCV 91.0 07/25/2016   PLT 303 07/25/2016   NEUTROABS 5.3 07/25/2016    Imaging:  No results found.  Medications: I have reviewed the patient's current medications.  Assessment/Plan: 1. Pancreas cancer, pancreas body/tail mass-FNA biopsy of a left liver lesion by EUS on 06/27/2016 confirmed poorly differential adenocarcinoma (features of adenocarcinoma with a degree of squamous differentiation) ? 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 Cycle 1 gemcitabine/Abraxane 07/11/2016 ? Cycle 2 gemcitabine/Abraxane 07/25/2016  2.   Left abdominal pain secondary to #1  3.   Anorexia/weight loss  4.   Status post  evaluation in the emergency department 07/13/2016 for fever and confusion. Urine culture showed enterococcus. She completed a course of amoxicillin.   Disposition: Crystal Small appears stable. She has completed 1 cycle of gemcitabine/Abraxane. Plan to proceed with cycle 2 today as scheduled. She will return for a follow-up visit and cycle 3 in 2 weeks. She will contact the office in the interim with any problems.  She will try Benadryl for sleep.  Plan reviewed with Dr. Benay Spice.  Ned Card ANP/GNP-BC   07/25/2016  1:47 PM

## 2016-07-25 NOTE — Progress Notes (Signed)
Nutrition follow-up completed with patient being treated for cancer of the pancreas. Weight continues to decrease and was documented as 110.1 pounds down from 112.8. Patient expresses frustration that her weight has dropped. She states she is eating more than ever before. Still reports taste alterations and early satiety.  Nutrition diagnosis: Unintended weight loss continues.  Intervention:  Provided support and encouragement for patient to continue strategies for increased calorie and protein intake. Recommended patient continue oral nutrition supplements and high calorie foods as tolerated. Questions were answered.  Teach back method was used.  Monitoring, evaluation, goals:  Patient will work to increase calories and protein to minimize further weight loss.  Next visit: To be scheduled as needed with future treatments.  **Disclaimer: This note was dictated with voice recognition software. Similar sounding words can inadvertently be transcribed and this note may contain transcription errors which may not have been corrected upon publication of note.**

## 2016-07-25 NOTE — Telephone Encounter (Signed)
Gave patient dtr avs report and appointments for September.  °

## 2016-08-04 ENCOUNTER — Other Ambulatory Visit: Payer: Self-pay | Admitting: Oncology

## 2016-08-08 ENCOUNTER — Ambulatory Visit (HOSPITAL_BASED_OUTPATIENT_CLINIC_OR_DEPARTMENT_OTHER): Payer: Medicare Other

## 2016-08-08 ENCOUNTER — Ambulatory Visit: Payer: Medicare Other

## 2016-08-08 ENCOUNTER — Other Ambulatory Visit: Payer: Self-pay | Admitting: Oncology

## 2016-08-08 ENCOUNTER — Telehealth: Payer: Self-pay | Admitting: Oncology

## 2016-08-08 ENCOUNTER — Telehealth: Payer: Self-pay | Admitting: *Deleted

## 2016-08-08 ENCOUNTER — Ambulatory Visit (HOSPITAL_BASED_OUTPATIENT_CLINIC_OR_DEPARTMENT_OTHER): Payer: Medicare Other | Admitting: Oncology

## 2016-08-08 ENCOUNTER — Other Ambulatory Visit: Payer: Medicare Other

## 2016-08-08 VITALS — BP 118/57 | HR 74 | Temp 98.1°F | Resp 18 | Ht 65.0 in | Wt 106.8 lb

## 2016-08-08 DIAGNOSIS — Z95828 Presence of other vascular implants and grafts: Secondary | ICD-10-CM

## 2016-08-08 DIAGNOSIS — R630 Anorexia: Secondary | ICD-10-CM

## 2016-08-08 DIAGNOSIS — R109 Unspecified abdominal pain: Secondary | ICD-10-CM

## 2016-08-08 DIAGNOSIS — Z5111 Encounter for antineoplastic chemotherapy: Secondary | ICD-10-CM | POA: Diagnosis not present

## 2016-08-08 DIAGNOSIS — C251 Malignant neoplasm of body of pancreas: Secondary | ICD-10-CM

## 2016-08-08 DIAGNOSIS — C787 Secondary malignant neoplasm of liver and intrahepatic bile duct: Secondary | ICD-10-CM

## 2016-08-08 DIAGNOSIS — R634 Abnormal weight loss: Secondary | ICD-10-CM | POA: Diagnosis not present

## 2016-08-08 DIAGNOSIS — Z23 Encounter for immunization: Secondary | ICD-10-CM

## 2016-08-08 LAB — CBC WITH DIFFERENTIAL/PLATELET
BASO%: 0.9 % (ref 0.0–2.0)
BASOS ABS: 0.1 10*3/uL (ref 0.0–0.1)
EOS%: 4.3 % (ref 0.0–7.0)
Eosinophils Absolute: 0.3 10*3/uL (ref 0.0–0.5)
HCT: 30.2 % — ABNORMAL LOW (ref 34.8–46.6)
HEMOGLOBIN: 9.9 g/dL — AB (ref 11.6–15.9)
LYMPH#: 1.4 10*3/uL (ref 0.9–3.3)
LYMPH%: 24.1 % (ref 14.0–49.7)
MCH: 30.2 pg (ref 25.1–34.0)
MCHC: 32.7 g/dL (ref 31.5–36.0)
MCV: 92.2 fL (ref 79.5–101.0)
MONO#: 0.9 10*3/uL (ref 0.1–0.9)
MONO%: 15.7 % — AB (ref 0.0–14.0)
NEUT#: 3.3 10*3/uL (ref 1.5–6.5)
NEUT%: 55 % (ref 38.4–76.8)
Platelets: 248 10*3/uL (ref 145–400)
RBC: 3.28 10*6/uL — ABNORMAL LOW (ref 3.70–5.45)
RDW: 19.2 % — AB (ref 11.2–14.5)
WBC: 6 10*3/uL (ref 3.9–10.3)

## 2016-08-08 LAB — COMPREHENSIVE METABOLIC PANEL
ALBUMIN: 2.9 g/dL — AB (ref 3.5–5.0)
ALK PHOS: 95 U/L (ref 40–150)
ALT: 37 U/L (ref 0–55)
AST: 29 U/L (ref 5–34)
Anion Gap: 9 mEq/L (ref 3–11)
BUN: 17.8 mg/dL (ref 7.0–26.0)
CO2: 25 mEq/L (ref 22–29)
Calcium: 8.7 mg/dL (ref 8.4–10.4)
Chloride: 106 mEq/L (ref 98–109)
Creatinine: 0.6 mg/dL (ref 0.6–1.1)
EGFR: 85 mL/min/{1.73_m2} — AB (ref >90)
GLUCOSE: 99 mg/dL (ref 70–140)
POTASSIUM: 3.9 meq/L (ref 3.5–5.1)
SODIUM: 139 meq/L (ref 136–145)
Total Bilirubin: 0.33 mg/dL (ref 0.20–1.20)
Total Protein: 6.1 g/dL — ABNORMAL LOW (ref 6.4–8.3)

## 2016-08-08 MED ORDER — HEPARIN SOD (PORK) LOCK FLUSH 100 UNIT/ML IV SOLN
500.0000 [IU] | Freq: Once | INTRAVENOUS | Status: AC | PRN
  Administered 2016-08-08: 500 [IU]
  Filled 2016-08-08: qty 5

## 2016-08-08 MED ORDER — PACLITAXEL PROTEIN-BOUND CHEMO INJECTION 100 MG
100.0000 mg/m2 | Freq: Once | INTRAVENOUS | Status: AC
  Administered 2016-08-08: 150 mg via INTRAVENOUS
  Filled 2016-08-08: qty 30

## 2016-08-08 MED ORDER — SODIUM CHLORIDE 0.9 % IJ SOLN
10.0000 mL | INTRAMUSCULAR | Status: DC | PRN
  Administered 2016-08-08: 10 mL via INTRAVENOUS
  Filled 2016-08-08: qty 10

## 2016-08-08 MED ORDER — ONDANSETRON HCL 8 MG PO TABS
ORAL_TABLET | ORAL | Status: AC
  Filled 2016-08-08: qty 1

## 2016-08-08 MED ORDER — INFLUENZA VAC SPLIT QUAD 0.5 ML IM SUSY
0.5000 mL | PREFILLED_SYRINGE | Freq: Once | INTRAMUSCULAR | Status: AC
  Administered 2016-08-08: 0.5 mL via INTRAMUSCULAR
  Filled 2016-08-08: qty 0.5

## 2016-08-08 MED ORDER — SODIUM CHLORIDE 0.9 % IV SOLN
Freq: Once | INTRAVENOUS | Status: AC
  Administered 2016-08-08: 10:00:00 via INTRAVENOUS

## 2016-08-08 MED ORDER — ONDANSETRON HCL 8 MG PO TABS
8.0000 mg | ORAL_TABLET | Freq: Once | ORAL | Status: AC
  Administered 2016-08-08: 8 mg via ORAL

## 2016-08-08 MED ORDER — SODIUM CHLORIDE 0.9% FLUSH
10.0000 mL | INTRAVENOUS | Status: DC | PRN
  Administered 2016-08-08: 10 mL
  Filled 2016-08-08: qty 10

## 2016-08-08 MED ORDER — SODIUM CHLORIDE 0.9 % IV SOLN
800.0000 mg/m2 | Freq: Once | INTRAVENOUS | Status: AC
  Administered 2016-08-08: 1216 mg via INTRAVENOUS
  Filled 2016-08-08: qty 31.98

## 2016-08-08 NOTE — Patient Instructions (Signed)

## 2016-08-08 NOTE — Patient Instructions (Signed)
Nanoparticle Albumin-Bound Paclitaxel injection  What is this medicine?  NANOPARTICLE ALBUMIN-BOUND PACLITAXEL (Na no PAHR ti kuhl al BYOO muhn-bound PAK li TAX el) is a chemotherapy drug. It targets fast dividing cells, like cancer cells, and causes these cells to die. This medicine is used to treat advanced breast cancer and advanced lung cancer.  This medicine may be used for other purposes; ask your health care provider or pharmacist if you have questions.  What should I tell my health care provider before I take this medicine?  They need to know if you have any of these conditions:  -kidney disease  -liver disease  -low blood counts, like low platelets, red blood cells, or white blood cells  -recent or ongoing radiation therapy  -an unusual or allergic reaction to paclitaxel, albumin, other chemotherapy, other medicines, foods, dyes, or preservatives  -pregnant or trying to get pregnant  -breast-feeding  How should I use this medicine?  This drug is given as an infusion into a vein. It is administered in a hospital or clinic by a specially trained health care professional.  Talk to your pediatrician regarding the use of this medicine in children. Special care may be needed.  Overdosage: If you think you have taken too much of this medicine contact a poison control center or emergency room at once.  NOTE: This medicine is only for you. Do not share this medicine with others.  What if I miss a dose?  It is important not to miss your dose. Call your doctor or health care professional if you are unable to keep an appointment.  What may interact with this medicine?  -cyclosporine  -diazepam  -ketoconazole  -medicines to increase blood counts like filgrastim, pegfilgrastim, sargramostim  -other chemotherapy drugs like cisplatin, doxorubicin, epirubicin, etoposide, teniposide, vincristine  -quinidine  -testosterone  -vaccines  -verapamil  Talk to your doctor or health care professional before taking any of these  medicines:  -acetaminophen  -aspirin  -ibuprofen  -ketoprofen  -naproxen  This list may not describe all possible interactions. Give your health care provider a list of all the medicines, herbs, non-prescription drugs, or dietary supplements you use. Also tell them if you smoke, drink alcohol, or use illegal drugs. Some items may interact with your medicine.  What should I watch for while using this medicine?  Your condition will be monitored carefully while you are receiving this medicine. You will need important blood work done while you are taking this medicine.  This drug may make you feel generally unwell. This is not uncommon, as chemotherapy can affect healthy cells as well as cancer cells. Report any side effects. Continue your course of treatment even though you feel ill unless your doctor tells you to stop.  In some cases, you may be given additional medicines to help with side effects. Follow all directions for their use.  Call your doctor or health care professional for advice if you get a fever, chills or sore throat, or other symptoms of a cold or flu. Do not treat yourself. This drug decreases your body's ability to fight infections. Try to avoid being around people who are sick.  This medicine may increase your risk to bruise or bleed. Call your doctor or health care professional if you notice any unusual bleeding.  Be careful brushing and flossing your teeth or using a toothpick because you may get an infection or bleed more easily. If you have any dental work done, tell your dentist you are receiving this   medicine.  Avoid taking products that contain aspirin, acetaminophen, ibuprofen, naproxen, or ketoprofen unless instructed by your doctor. These medicines may hide a fever.  Do not become pregnant while taking this medicine. Women should inform their doctor if they wish to become pregnant or think they might be pregnant. There is a potential for serious side effects to an unborn child. Talk to  your health care professional or pharmacist for more information. Do not breast-feed an infant while taking this medicine.  Men are advised not to father a child while receiving this medicine.  What side effects may I notice from receiving this medicine?  Side effects that you should report to your doctor or health care professional as soon as possible:  -allergic reactions like skin rash, itching or hives, swelling of the face, lips, or tongue  -low blood counts - This drug may decrease the number of white blood cells, red blood cells and platelets. You may be at increased risk for infections and bleeding.  -signs of infection - fever or chills, cough, sore throat, pain or difficulty passing urine  -signs of decreased platelets or bleeding - bruising, pinpoint red spots on the skin, black, tarry stools, nosebleeds  -signs of decreased red blood cells - unusually weak or tired, fainting spells, lightheadedness  -breathing problems  -changes in vision  -chest pain  -high or low blood pressure  -mouth sores  -nausea and vomiting  -pain, swelling, redness or irritation at the injection site  -pain, tingling, numbness in the hands or feet  -slow or irregular heartbeat  -swelling of the ankle, feet, hands  Side effects that usually do not require medical attention (report to your doctor or health care professional if they continue or are bothersome):  -aches, pains  -changes in the color of fingernails  -diarrhea  -hair loss  -loss of appetite  This list may not describe all possible side effects. Call your doctor for medical advice about side effects. You may report side effects to FDA at 1-800-FDA-1088.  Where should I keep my medicine?  This drug is given in a hospital or clinic and will not be stored at home.  NOTE: This sheet is a summary. It may not cover all possible information. If you have questions about this medicine, talk to your doctor, pharmacist, or health care provider.      2016, [employer]vier/Gold Standard.  (2013-01-04 16:48:50)  Gemcitabine injection  What is this medicine?  GEMCITABINE (jem SIT a been) is a chemotherapy drug. This medicine is used to treat many types of cancer like breast cancer, lung cancer, pancreatic cancer, and ovarian cancer.  This medicine may be used for other purposes; ask your health care provider or pharmacist if you have questions.  What should I tell my health care provider before I take this medicine?  They need to know if you have any of these conditions:  -blood disorders  -infection  -kidney disease  -liver disease  -recent or ongoing radiation therapy  -an unusual or allergic reaction to gemcitabine, other chemotherapy, other medicines, foods, dyes, or preservatives  -pregnant or trying to get pregnant  -breast-feeding  How should I use this medicine?  This drug is given as an infusion into a vein. It is administered in a hospital or clinic by a specially trained health care professional.  Talk to your pediatrician regarding the use of this medicine in children. Special care may be needed.  Overdosage: If you think you have taken too much of this   medicine contact a poison control center or emergency room at once.  NOTE: This medicine is only for you. Do not share this medicine with others.  What if I miss a dose?  It is important not to miss your dose. Call your doctor or health care professional if you are unable to keep an appointment.  What may interact with this medicine?  -medicines to increase blood counts like filgrastim, pegfilgrastim, sargramostim  -some other chemotherapy drugs like cisplatin  -vaccines  Talk to your doctor or health care professional before taking any of these medicines:  -acetaminophen  -aspirin  -ibuprofen  -ketoprofen  -naproxen  This list may not describe all possible interactions. Give your health care provider a list of all the medicines, herbs, non-prescription drugs, or dietary supplements you use. Also tell them if you smoke, drink alcohol, or use  illegal drugs. Some items may interact with your medicine.  What should I watch for while using this medicine?  Visit your doctor for checks on your progress. This drug may make you feel generally unwell. This is not uncommon, as chemotherapy can affect healthy cells as well as cancer cells. Report any side effects. Continue your course of treatment even though you feel ill unless your doctor tells you to stop.  In some cases, you may be given additional medicines to help with side effects. Follow all directions for their use.  Call your doctor or health care professional for advice if you get a fever, chills or sore throat, or other symptoms of a cold or flu. Do not treat yourself. This drug decreases your body's ability to fight infections. Try to avoid being around people who are sick.  This medicine may increase your risk to bruise or bleed. Call your doctor or health care professional if you notice any unusual bleeding.  Be careful brushing and flossing your teeth or using a toothpick because you may get an infection or bleed more easily. If you have any dental work done, tell your dentist you are receiving this medicine.  Avoid taking products that contain aspirin, acetaminophen, ibuprofen, naproxen, or ketoprofen unless instructed by your doctor. These medicines may hide a fever.  Women should inform their doctor if they wish to become pregnant or think they might be pregnant. There is a potential for serious side effects to an unborn child. Talk to your health care professional or pharmacist for more information. Do not breast-feed an infant while taking this medicine.  What side effects may I notice from receiving this medicine?  Side effects that you should report to your doctor or health care professional as soon as possible:  -allergic reactions like skin rash, itching or hives, swelling of the face, lips, or tongue  -low blood counts - this medicine may decrease the number of white blood cells, red  blood cells and platelets. You may be at increased risk for infections and bleeding.  -signs of infection - fever or chills, cough, sore throat, pain or difficulty passing urine  -signs of decreased platelets or bleeding - bruising, pinpoint red spots on the skin, black, tarry stools, blood in the urine  -signs of decreased red blood cells - unusually weak or tired, fainting spells, lightheadedness  -breathing problems  -chest pain  -mouth sores  -nausea and vomiting  -pain, swelling, redness at site where injected  -pain, tingling, numbness in the hands or feet  -stomach pain  -swelling of ankles, feet, hands  -unusual bleeding  Side effects that usually do

## 2016-08-08 NOTE — Telephone Encounter (Signed)
Message sent to chemo scheduler to be added. Avs report and schedule given to patient, per 08/08/16 los.

## 2016-08-08 NOTE — Telephone Encounter (Signed)
Per LOS I have scheduled appts and notified the scheduler 

## 2016-08-08 NOTE — Progress Notes (Signed)
  [place] OFFICE PROGRESS NOTE   Diagnosis: Pancreas cancer  INTERVAL HISTORY:   Crystal Small returns as scheduled. She completed another treatment with gemcitabine/Abraxane on 07/25/2016. No nausea or neuropathy symptoms following chemotherapy. She had a fever on the day of chemotherapy. She has no abdominal pain. Good appetite. She developed pruritus and a fine dry erythematous rash over the trunk and extremities a few days after chemotherapy. She wonders whether this was related to "dry "skin.  Objective:  Vital signs in last 24 hours:  Blood pressure (!) 118/57, pulse 74, temperature 98.1 F (36.7 C), temperature source Oral, resp. rate 18, height 5\' 5"  (1.651 m), weight 106 lb 12.8 oz (48.4 kg), SpO2 100 %.    HEENT: No thrush or ulcers Resp: Lungs clear bilaterally Cardio: Regular rate and rhythm GI: No hepatosplenomegaly, no mass, mild tenderness in the left abdomen Vascular: No leg edema  Skin: No rash over the trunk   Portacath/PICC-without erythema  Lab Results:  Lab Results  Component Value Date   WBC 6.0 08/08/2016   HGB 9.9 (L) 08/08/2016   HCT 30.2 (L) 08/08/2016   MCV 92.2 08/08/2016   PLT 248 08/08/2016   NEUTROABS 3.3 08/08/2016     Medications: I have reviewed the patient's current medications.  Assessment/Plan: 1. Pancreas cancer, pancreas body/tail mass-FNA biopsy of a left liver lesion by EUS on 06/27/2016 confirmed poorly differential adenocarcinoma (features of adenocarcinoma with a degree of squamous differentiation) ? 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 Cycle 1 gemcitabine/Abraxane 07/11/2016 ? Cycle 2 gemcitabine/Abraxane 07/25/2016 ? Cycle 3 gemcitabine/Abraxane 08/08/2016  2. Left abdominal pain secondary to #1  3. Anorexia/weight loss  4.   Status post evaluation in the  emergency department 07/13/2016 for fever and confusion. Urine culture showed enterococcus. She completed a course of amoxicillin.    Disposition:  Crystal Small appears well. The plan is to proceed with cycle 3 gemcitabine/Abraxane today. She will contact us if she develops a rash following this cycle. She will return for an office visit and chemotherapy in 2 weeks.  The plan is to obtain a restaging CT after 5 or 6 cycles of chemotherapy.  [name], MD  08/08/2016  9:37 AM

## 2016-08-22 ENCOUNTER — Ambulatory Visit (HOSPITAL_BASED_OUTPATIENT_CLINIC_OR_DEPARTMENT_OTHER): Payer: Medicare Other

## 2016-08-22 ENCOUNTER — Telehealth: Payer: Self-pay | Admitting: Oncology

## 2016-08-22 ENCOUNTER — Other Ambulatory Visit (HOSPITAL_BASED_OUTPATIENT_CLINIC_OR_DEPARTMENT_OTHER): Payer: Medicare Other

## 2016-08-22 ENCOUNTER — Telehealth: Payer: Self-pay | Admitting: *Deleted

## 2016-08-22 ENCOUNTER — Ambulatory Visit (HOSPITAL_BASED_OUTPATIENT_CLINIC_OR_DEPARTMENT_OTHER): Payer: Medicare Other | Admitting: Oncology

## 2016-08-22 ENCOUNTER — Ambulatory Visit: Payer: Medicare Other

## 2016-08-22 VITALS — BP 117/55 | HR 79 | Temp 98.4°F | Resp 18 | Ht 65.0 in | Wt 107.1 lb

## 2016-08-22 DIAGNOSIS — C787 Secondary malignant neoplasm of liver and intrahepatic bile duct: Secondary | ICD-10-CM

## 2016-08-22 DIAGNOSIS — C251 Malignant neoplasm of body of pancreas: Secondary | ICD-10-CM | POA: Diagnosis not present

## 2016-08-22 DIAGNOSIS — R140 Abdominal distension (gaseous): Secondary | ICD-10-CM

## 2016-08-22 DIAGNOSIS — R502 Drug induced fever: Secondary | ICD-10-CM

## 2016-08-22 DIAGNOSIS — Z5111 Encounter for antineoplastic chemotherapy: Secondary | ICD-10-CM | POA: Diagnosis not present

## 2016-08-22 DIAGNOSIS — Z95828 Presence of other vascular implants and grafts: Secondary | ICD-10-CM

## 2016-08-22 LAB — CBC WITH DIFFERENTIAL/PLATELET
BASO%: 1 % (ref 0.0–2.0)
BASOS ABS: 0.1 10*3/uL (ref 0.0–0.1)
EOS%: 3 % (ref 0.0–7.0)
Eosinophils Absolute: 0.3 10*3/uL (ref 0.0–0.5)
HEMATOCRIT: 30.7 % — AB (ref 34.8–46.6)
HEMOGLOBIN: 10.1 g/dL — AB (ref 11.6–15.9)
LYMPH#: 1.4 10*3/uL (ref 0.9–3.3)
LYMPH%: 16.4 % (ref 14.0–49.7)
MCH: 30.9 pg (ref 25.1–34.0)
MCHC: 33 g/dL (ref 31.5–36.0)
MCV: 93.5 fL (ref 79.5–101.0)
MONO#: 1.5 10*3/uL — AB (ref 0.1–0.9)
MONO%: 17.7 % — ABNORMAL HIGH (ref 0.0–14.0)
NEUT#: 5.2 10*3/uL (ref 1.5–6.5)
NEUT%: 61.9 % (ref 38.4–76.8)
PLATELETS: 232 10*3/uL (ref 145–400)
RBC: 3.28 10*6/uL — ABNORMAL LOW (ref 3.70–5.45)
RDW: 21.5 % — AB (ref 11.2–14.5)
WBC: 8.3 10*3/uL (ref 3.9–10.3)

## 2016-08-22 LAB — COMPREHENSIVE METABOLIC PANEL
ALBUMIN: 2.9 g/dL — AB (ref 3.5–5.0)
ALK PHOS: 98 U/L (ref 40–150)
ALT: 32 U/L (ref 0–55)
AST: 29 U/L (ref 5–34)
Anion Gap: 8 mEq/L (ref 3–11)
BILIRUBIN TOTAL: 0.48 mg/dL (ref 0.20–1.20)
BUN: 17.9 mg/dL (ref 7.0–26.0)
CALCIUM: 8.9 mg/dL (ref 8.4–10.4)
CO2: 23 mEq/L (ref 22–29)
CREATININE: 0.6 mg/dL (ref 0.6–1.1)
Chloride: 105 mEq/L (ref 98–109)
EGFR: 85 mL/min/{1.73_m2} — ABNORMAL LOW (ref >90)
Glucose: 129 mg/dl (ref 70–140)
Potassium: 3.9 mEq/L (ref 3.5–5.1)
Sodium: 137 mEq/L (ref 136–145)
Total Protein: 6.2 g/dL — ABNORMAL LOW (ref 6.4–8.3)

## 2016-08-22 MED ORDER — SODIUM CHLORIDE 0.9 % IV SOLN
Freq: Once | INTRAVENOUS | Status: AC
  Administered 2016-08-22: 11:00:00 via INTRAVENOUS

## 2016-08-22 MED ORDER — SODIUM CHLORIDE 0.9 % IJ SOLN
10.0000 mL | INTRAMUSCULAR | Status: DC | PRN
  Administered 2016-08-22: 10 mL via INTRAVENOUS
  Filled 2016-08-22: qty 10

## 2016-08-22 MED ORDER — SODIUM CHLORIDE 0.9% FLUSH
10.0000 mL | INTRAVENOUS | Status: DC | PRN
  Administered 2016-08-22: 10 mL
  Filled 2016-08-22: qty 10

## 2016-08-22 MED ORDER — PACLITAXEL PROTEIN-BOUND CHEMO INJECTION 100 MG
100.0000 mg/m2 | Freq: Once | INTRAVENOUS | Status: AC
  Administered 2016-08-22: 150 mg via INTRAVENOUS
  Filled 2016-08-22: qty 30

## 2016-08-22 MED ORDER — ONDANSETRON HCL 8 MG PO TABS
ORAL_TABLET | ORAL | Status: AC
  Filled 2016-08-22: qty 1

## 2016-08-22 MED ORDER — SODIUM CHLORIDE 0.9 % IV SOLN
800.0000 mg/m2 | Freq: Once | INTRAVENOUS | Status: AC
  Administered 2016-08-22: 1216 mg via INTRAVENOUS
  Filled 2016-08-22: qty 31.98

## 2016-08-22 MED ORDER — HEPARIN SOD (PORK) LOCK FLUSH 100 UNIT/ML IV SOLN
500.0000 [IU] | Freq: Once | INTRAVENOUS | Status: AC | PRN
Start: 2016-08-22 — End: 2016-08-22
  Administered 2016-08-22: 500 [IU]
  Filled 2016-08-22: qty 5

## 2016-08-22 MED ORDER — ONDANSETRON HCL 8 MG PO TABS
8.0000 mg | ORAL_TABLET | Freq: Once | ORAL | Status: AC
  Administered 2016-08-22: 8 mg via ORAL

## 2016-08-22 NOTE — Progress Notes (Signed)
  [place] OFFICE PROGRESS NOTE   Diagnosis: Pancreas cancer  INTERVAL HISTORY:   Crystal Small returns as scheduled. She completed another treatment with gemcitabine and Abraxane on 08/08/2016. She had a fever in the afternoon for approximate 3 days following chemotherapy. No neuropathy symptoms or rash. No abdominal pain. She notes an intermittent fullness in the left lower abdomen with associated tenderness. She believes the fullness is associated with not having a bowel movement.  Objective:  Vital signs in last 24 hours:  Blood pressure (!) 117/55, pulse 79, temperature 98.4 F (36.9 C), temperature source Oral, resp. rate 18, height 5\' 5"  (1.651 m), weight 107 lb 1.6 oz (48.6 kg), SpO2 100 %.    HEENT: No thrush or ulcers Resp: Lungs clear bilaterally Cardio: Regular rate and rhythm GI: No hepatosplenomegaly, in the left lower abdomen medial to the iliac there is a 1-2 cm firm mobile nodular area on deep palpation. Vascular: No leg edema  Portacath/PICC-without erythema  Lab Results:  Lab Results  Component Value Date   WBC 8.3 08/22/2016   HGB 10.1 (L) 08/22/2016   HCT 30.7 (L) 08/22/2016   MCV 93.5 08/22/2016   PLT 232 08/22/2016   NEUTROABS 5.2 08/22/2016     Medications: I have reviewed the patient's current medications.  Assessment/Plan: 1. Pancreas cancer, pancreas body/tail mass-FNA biopsy of a left liver lesion by EUS on 06/27/2016 confirmed poorly differential adenocarcinoma (features of adenocarcinoma with a degree of squamous differentiation) ? 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 Cycle 1 gemcitabine/Abraxane 07/11/2016 ? Cycle 2 gemcitabine/Abraxane 07/25/2016 ? Cycle 3 gemcitabine/Abraxane 08/08/2016 ? Cycle 4 gemcitabine/Abraxane 08/22/2016  2. Left abdominal pain secondary to #1  3.  Anorexia/weight loss  4. Status post evaluation in the emergency department 07/13/2016 for fever and confusion. Urine culture showed enterococcus. She completed a course of amoxicillin.     Disposition:  Crystal Small has completed 3 treatments with gemcitabine/Abraxane. She has a fever in the afternoon/evening for a few days following chemotherapy. This is likely secondary to gemcitabine. She will try taking Tylenol prophylactically in the afternoons. The left lower abdominal fullness is likely a benign finding.  She will return for an office visit and cycle 5 chemotherapy in 2 weeks. She will be scheduled for a restaging CT evaluation after cycle 5.  [name], MD  08/22/2016  10:16 AM

## 2016-08-22 NOTE — Patient Instructions (Signed)
[facility] Discharge Instructions for Patients Receiving Chemotherapy  Today you received the following chemotherapy agents: Abraxane and Gemzar   To help prevent nausea and vomiting after your treatment, we encourage you to take your nausea medication as directed.    If you develop nausea and vomiting that is not controlled by your nausea medication, call the clinic.   BELOW ARE SYMPTOMS THAT SHOULD BE REPORTED IMMEDIATELY:  *FEVER GREATER THAN 100.5 F  *CHILLS WITH OR WITHOUT FEVER  NAUSEA AND VOMITING THAT IS NOT CONTROLLED WITH YOUR NAUSEA MEDICATION  *UNUSUAL SHORTNESS OF BREATH  *UNUSUAL BRUISING OR BLEEDING  TENDERNESS IN MOUTH AND THROAT WITH OR WITHOUT PRESENCE OF ULCERS  *URINARY PROBLEMS  *BOWEL PROBLEMS  UNUSUAL RASH Items with * indicate a potential emergency and should be followed up as soon as possible.  Feel free to call the clinic you have any questions or concerns. The clinic phone number is [phone].  Please show the CHEMO ALERT CARD at check-in to the Emergency Department and triage nurse.   

## 2016-08-22 NOTE — Telephone Encounter (Signed)
Message sent to chemo scheduler to add chemo. Avs report and appointment schedule given to patient, per 08/22/16 los. °

## 2016-08-22 NOTE — Telephone Encounter (Signed)
Per LOS I have scheduled appts and notified the scheduler 

## 2016-08-23 ENCOUNTER — Other Ambulatory Visit: Payer: Self-pay | Admitting: *Deleted

## 2016-08-23 MED ORDER — ALPRAZOLAM 0.25 MG PO TABS
0.2500 mg | ORAL_TABLET | Freq: Every evening | ORAL | 0 refills | Status: DC | PRN
Start: 2016-08-23 — End: 2016-10-10

## 2016-09-01 ENCOUNTER — Other Ambulatory Visit: Payer: Self-pay | Admitting: Oncology

## 2016-09-05 ENCOUNTER — Ambulatory Visit: Payer: Medicare Other

## 2016-09-05 ENCOUNTER — Ambulatory Visit (HOSPITAL_BASED_OUTPATIENT_CLINIC_OR_DEPARTMENT_OTHER): Payer: Medicare Other | Admitting: Nurse Practitioner

## 2016-09-05 ENCOUNTER — Ambulatory Visit (HOSPITAL_BASED_OUTPATIENT_CLINIC_OR_DEPARTMENT_OTHER): Payer: Medicare Other

## 2016-09-05 ENCOUNTER — Ambulatory Visit: Payer: Medicare Other | Admitting: Oncology

## 2016-09-05 ENCOUNTER — Telehealth: Payer: Self-pay

## 2016-09-05 ENCOUNTER — Telehealth: Payer: Self-pay | Admitting: Oncology

## 2016-09-05 ENCOUNTER — Other Ambulatory Visit (HOSPITAL_BASED_OUTPATIENT_CLINIC_OR_DEPARTMENT_OTHER): Payer: Medicare Other

## 2016-09-05 VITALS — BP 105/46 | HR 85 | Temp 98.9°F | Resp 18 | Ht 65.0 in | Wt 109.2 lb

## 2016-09-05 DIAGNOSIS — R630 Anorexia: Secondary | ICD-10-CM

## 2016-09-05 DIAGNOSIS — C252 Malignant neoplasm of tail of pancreas: Secondary | ICD-10-CM

## 2016-09-05 DIAGNOSIS — R634 Abnormal weight loss: Secondary | ICD-10-CM

## 2016-09-05 DIAGNOSIS — Z5111 Encounter for antineoplastic chemotherapy: Secondary | ICD-10-CM | POA: Diagnosis not present

## 2016-09-05 DIAGNOSIS — C251 Malignant neoplasm of body of pancreas: Secondary | ICD-10-CM

## 2016-09-05 DIAGNOSIS — Z95828 Presence of other vascular implants and grafts: Secondary | ICD-10-CM

## 2016-09-05 DIAGNOSIS — C787 Secondary malignant neoplasm of liver and intrahepatic bile duct: Secondary | ICD-10-CM

## 2016-09-05 DIAGNOSIS — R109 Unspecified abdominal pain: Secondary | ICD-10-CM | POA: Diagnosis not present

## 2016-09-05 LAB — COMPREHENSIVE METABOLIC PANEL
ALT: 51 U/L (ref 0–55)
ANION GAP: 7 meq/L (ref 3–11)
AST: 41 U/L — AB (ref 5–34)
Albumin: 2.9 g/dL — ABNORMAL LOW (ref 3.5–5.0)
Alkaline Phosphatase: 92 U/L (ref 40–150)
BILIRUBIN TOTAL: 0.39 mg/dL (ref 0.20–1.20)
BUN: 16.2 mg/dL (ref 7.0–26.0)
CHLORIDE: 104 meq/L (ref 98–109)
CO2: 25 meq/L (ref 22–29)
CREATININE: 0.6 mg/dL (ref 0.6–1.1)
Calcium: 9 mg/dL (ref 8.4–10.4)
EGFR: 86 mL/min/{1.73_m2} — ABNORMAL LOW (ref >90)
Glucose: 126 mg/dl (ref 70–140)
Potassium: 4.1 mEq/L (ref 3.5–5.1)
Sodium: 136 mEq/L (ref 136–145)
TOTAL PROTEIN: 6 g/dL — AB (ref 6.4–8.3)

## 2016-09-05 LAB — CBC WITH DIFFERENTIAL/PLATELET
BASO%: 0.8 % (ref 0.0–2.0)
Basophils Absolute: 0.1 10*3/uL (ref 0.0–0.1)
EOS%: 2.2 % (ref 0.0–7.0)
Eosinophils Absolute: 0.2 10*3/uL (ref 0.0–0.5)
HEMATOCRIT: 30.4 % — AB (ref 34.8–46.6)
HGB: 9.8 g/dL — ABNORMAL LOW (ref 11.6–15.9)
LYMPH#: 1.1 10*3/uL (ref 0.9–3.3)
LYMPH%: 13.8 % — ABNORMAL LOW (ref 14.0–49.7)
MCH: 31 pg (ref 25.1–34.0)
MCHC: 32.1 g/dL (ref 31.5–36.0)
MCV: 96.4 fL (ref 79.5–101.0)
MONO#: 1.4 10*3/uL — AB (ref 0.1–0.9)
MONO%: 18.2 % — ABNORMAL HIGH (ref 0.0–14.0)
NEUT%: 65 % (ref 38.4–76.8)
NEUTROS ABS: 5 10*3/uL (ref 1.5–6.5)
PLATELETS: 302 10*3/uL (ref 145–400)
RBC: 3.16 10*6/uL — AB (ref 3.70–5.45)
RDW: 21.3 % — ABNORMAL HIGH (ref 11.2–14.5)
WBC: 7.7 10*3/uL (ref 3.9–10.3)

## 2016-09-05 MED ORDER — SODIUM CHLORIDE 0.9% FLUSH
10.0000 mL | INTRAVENOUS | Status: DC | PRN
  Administered 2016-09-05: 10 mL
  Filled 2016-09-05: qty 10

## 2016-09-05 MED ORDER — SODIUM CHLORIDE 0.9 % IV SOLN
Freq: Once | INTRAVENOUS | Status: AC
  Administered 2016-09-05: 10:00:00 via INTRAVENOUS

## 2016-09-05 MED ORDER — SODIUM CHLORIDE 0.9 % IV SOLN
800.0000 mg/m2 | Freq: Once | INTRAVENOUS | Status: AC
  Administered 2016-09-05: 1216 mg via INTRAVENOUS
  Filled 2016-09-05: qty 31.98

## 2016-09-05 MED ORDER — HEPARIN SOD (PORK) LOCK FLUSH 100 UNIT/ML IV SOLN
500.0000 [IU] | Freq: Once | INTRAVENOUS | Status: AC | PRN
  Administered 2016-09-05: 500 [IU]
  Filled 2016-09-05: qty 5

## 2016-09-05 MED ORDER — ONDANSETRON HCL 8 MG PO TABS
8.0000 mg | ORAL_TABLET | Freq: Once | ORAL | Status: AC
  Administered 2016-09-05: 8 mg via ORAL

## 2016-09-05 MED ORDER — ONDANSETRON HCL 8 MG PO TABS
ORAL_TABLET | ORAL | Status: AC
  Filled 2016-09-05: qty 1

## 2016-09-05 MED ORDER — SODIUM CHLORIDE 0.9 % IJ SOLN
10.0000 mL | INTRAMUSCULAR | Status: DC | PRN
  Administered 2016-09-05: 10 mL via INTRAVENOUS
  Filled 2016-09-05: qty 10

## 2016-09-05 MED ORDER — PACLITAXEL PROTEIN-BOUND CHEMO INJECTION 100 MG
100.0000 mg/m2 | Freq: Once | INTRAVENOUS | Status: AC
  Administered 2016-09-05: 150 mg via INTRAVENOUS
  Filled 2016-09-05: qty 30

## 2016-09-05 NOTE — Patient Instructions (Signed)

## 2016-09-05 NOTE — Telephone Encounter (Signed)
Called central scheduling to have patients CT scan scheduled for 10/19 per 08/22/16 referral. Pt scheduled for 9am on 10/19 and instructions and contrast provided to pt.

## 2016-09-05 NOTE — Progress Notes (Signed)
  [place] OFFICE PROGRESS NOTE   Diagnosis:  Pancreas cancer  INTERVAL HISTORY:   Crystal Small returns as scheduled. She completed cycle 4 gemcitabine/Abraxane 08/22/2016. She overall is feeling well. She denies nausea/vomiting. No mouth sores. No diarrhea. No rash. No numbness or tingling in her hands or feet. She again had fever beginning the night of chemotherapy lasting for about 4 days. She took scheduled Tylenol with good relief. The abdominal pain she was experiencing prior to initiation of chemotherapy has resolved. She reports a good appetite though she does note an alteration in taste. She is gaining weight.  Objective:  Vital signs in last 24 hours:  Blood pressure (!) 105/46, pulse 85, temperature 98.9 F (37.2 C), temperature source Oral, resp. rate 18, height 5\' 5"  (1.651 m), weight 109 lb 3.2 oz (49.5 kg), SpO2 99 %.    HEENT: No thrush or ulcers. Resp: Lungs clear bilaterally. Cardio: Regular rate and rhythm. GI: Abdomen is soft and nontender. No hepatomegaly. Left lower abdomen medial to the iliac there is a firm tubular shaped area on deep palpation measuring approximately 2 cm . Vascular: No leg edema. Calves soft and nontender. Neuro: Vibratory sense intact over the fingertips per tuning fork exam.  Skin: No rash. Port-A-Cath without erythema.    Lab Results:  Lab Results  Component Value Date   WBC 7.7 09/05/2016   HGB 9.8 (L) 09/05/2016   HCT 30.4 (L) 09/05/2016   MCV 96.4 09/05/2016   PLT 302 09/05/2016   NEUTROABS 5.0 09/05/2016    Imaging:  No results found.  Medications: I have reviewed the patient's current medications.  Assessment/Plan: 1. Pancreas cancer, pancreas body/tail mass-FNA biopsy of a left liver lesion by EUS on 06/27/2016 confirmed poorly differential adenocarcinoma (features of adenocarcinoma with a degree of squamous differentiation) ? 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 Cycle 1 gemcitabine/Abraxane 07/11/2016 ? Cycle 2 gemcitabine/Abraxane 07/25/2016 ? Cycle 3 gemcitabine/Abraxane 08/08/2016 ? Cycle 4 gemcitabine/Abraxane 08/22/2016 ? Cycle 5 gemcitabine/Abraxane 09/05/2016  2. Left abdominal pain secondary to #1. Improved  3. Anorexia/weight loss  4. Status post evaluation in the emergency department 07/13/2016 for fever and confusion. Urine culture showed enterococcus. She completed a course of amoxicillin.   Disposition: [name] appears stable. She has completed 4 cycles of gemcitabine/Abraxane. Plan to proceed with cycle 5 today as scheduled. She will have restaging CT scans 09/12/2016. She will return for a follow-up visit and chemotherapy in 2 weeks. She will contact the office in the interim with any problems.  Plan reviewed with Dr. Benay Spice.    Ned Card ANP/GNP-BC   09/05/2016  10:04 AM

## 2016-09-05 NOTE — Patient Instructions (Signed)
[facility] Discharge Instructions for Patients Receiving Chemotherapy  Today you received the following chemotherapy agents: Abraxane and Gemzar   To help prevent nausea and vomiting after your treatment, we encourage you to take your nausea medication as directed.    If you develop nausea and vomiting that is not controlled by your nausea medication, call the clinic.   BELOW ARE SYMPTOMS THAT SHOULD BE REPORTED IMMEDIATELY:  *FEVER GREATER THAN 100.5 F  *CHILLS WITH OR WITHOUT FEVER  NAUSEA AND VOMITING THAT IS NOT CONTROLLED WITH YOUR NAUSEA MEDICATION  *UNUSUAL SHORTNESS OF BREATH  *UNUSUAL BRUISING OR BLEEDING  TENDERNESS IN MOUTH AND THROAT WITH OR WITHOUT PRESENCE OF ULCERS  *URINARY PROBLEMS  *BOWEL PROBLEMS  UNUSUAL RASH Items with * indicate a potential emergency and should be followed up as soon as possible.  Feel free to call the clinic you have any questions or concerns. The clinic phone number is [phone].  Please show the CHEMO ALERT CARD at check-in to the Emergency Department and triage nurse.   

## 2016-09-05 NOTE — Telephone Encounter (Signed)
No 10/12 los/orders/referrals °

## 2016-09-12 ENCOUNTER — Encounter (HOSPITAL_COMMUNITY): Payer: Self-pay

## 2016-09-12 ENCOUNTER — Ambulatory Visit (HOSPITAL_COMMUNITY)
Admission: RE | Admit: 2016-09-12 | Discharge: 2016-09-12 | Disposition: A | Discharge status: Home / Self Care | Payer: Medicare Other | Source: Ambulatory Visit | Attending: Oncology | Admitting: Oncology

## 2016-09-12 DIAGNOSIS — C251 Malignant neoplasm of body of pancreas: Secondary | ICD-10-CM | POA: Diagnosis present

## 2016-09-12 DIAGNOSIS — C787 Secondary malignant neoplasm of liver and intrahepatic bile duct: Secondary | ICD-10-CM | POA: Insufficient documentation

## 2016-09-12 MED ORDER — IOPAMIDOL (ISOVUE-300) INJECTION 61%
100.0000 mL | Freq: Once | INTRAVENOUS | Status: AC | PRN
  Administered 2016-09-12: 100 mL via INTRAVENOUS

## 2016-09-15 ENCOUNTER — Other Ambulatory Visit: Payer: Self-pay | Admitting: Oncology

## 2016-09-18 ENCOUNTER — Telehealth: Payer: Self-pay | Admitting: *Deleted

## 2016-09-18 NOTE — Telephone Encounter (Signed)
Call from pt's daughter reporting she viewed CT report on MyChart. Asks that we do not call her mother before tomorrow's visit.  Daughter asked about the treatment plan for liver lesions. Informed her plan will be discussed at visit, she voiced understanding.

## 2016-09-19 ENCOUNTER — Ambulatory Visit: Payer: Medicare Other

## 2016-09-19 ENCOUNTER — Other Ambulatory Visit (HOSPITAL_BASED_OUTPATIENT_CLINIC_OR_DEPARTMENT_OTHER): Payer: Medicare Other

## 2016-09-19 ENCOUNTER — Encounter: Payer: Self-pay | Admitting: *Deleted

## 2016-09-19 ENCOUNTER — Encounter: Payer: Self-pay | Admitting: Nutrition

## 2016-09-19 ENCOUNTER — Encounter: Payer: Medicare Other | Admitting: Nutrition

## 2016-09-19 ENCOUNTER — Ambulatory Visit (HOSPITAL_BASED_OUTPATIENT_CLINIC_OR_DEPARTMENT_OTHER): Payer: Medicare Other | Admitting: Nurse Practitioner

## 2016-09-19 VITALS — BP 125/49 | HR 74 | Temp 98.4°F | Resp 18 | Ht 65.0 in | Wt 107.1 lb

## 2016-09-19 DIAGNOSIS — C251 Malignant neoplasm of body of pancreas: Secondary | ICD-10-CM

## 2016-09-19 DIAGNOSIS — C787 Secondary malignant neoplasm of liver and intrahepatic bile duct: Secondary | ICD-10-CM

## 2016-09-19 DIAGNOSIS — R109 Unspecified abdominal pain: Secondary | ICD-10-CM

## 2016-09-19 DIAGNOSIS — Z95828 Presence of other vascular implants and grafts: Secondary | ICD-10-CM

## 2016-09-19 DIAGNOSIS — R630 Anorexia: Secondary | ICD-10-CM

## 2016-09-19 DIAGNOSIS — R634 Abnormal weight loss: Secondary | ICD-10-CM

## 2016-09-19 DIAGNOSIS — C252 Malignant neoplasm of tail of pancreas: Secondary | ICD-10-CM | POA: Diagnosis not present

## 2016-09-19 LAB — CBC WITH DIFFERENTIAL/PLATELET
BASO%: 0.5 % (ref 0.0–2.0)
BASOS ABS: 0 10*3/uL (ref 0.0–0.1)
EOS%: 1.7 % (ref 0.0–7.0)
Eosinophils Absolute: 0.1 10*3/uL (ref 0.0–0.5)
HCT: 29.8 % — ABNORMAL LOW (ref 34.8–46.6)
HEMOGLOBIN: 9.7 g/dL — AB (ref 11.6–15.9)
LYMPH%: 14.6 % (ref 14.0–49.7)
MCH: 31.5 pg (ref 25.1–34.0)
MCHC: 32.6 g/dL (ref 31.5–36.0)
MCV: 96.8 fL (ref 79.5–101.0)
MONO#: 1.6 10*3/uL — ABNORMAL HIGH (ref 0.1–0.9)
MONO%: 19.4 % — AB (ref 0.0–14.0)
NEUT#: 5.4 10*3/uL (ref 1.5–6.5)
NEUT%: 63.8 % (ref 38.4–76.8)
Platelets: 290 10*3/uL (ref 145–400)
RBC: 3.08 10*6/uL — AB (ref 3.70–5.45)
RDW: 18.1 % — AB (ref 11.2–14.5)
WBC: 8.5 10*3/uL (ref 3.9–10.3)
lymph#: 1.2 10*3/uL (ref 0.9–3.3)

## 2016-09-19 LAB — COMPREHENSIVE METABOLIC PANEL
ALT: 44 U/L (ref 0–55)
AST: 36 U/L — AB (ref 5–34)
Albumin: 2.8 g/dL — ABNORMAL LOW (ref 3.5–5.0)
Alkaline Phosphatase: 108 U/L (ref 40–150)
Anion Gap: 5 mEq/L (ref 3–11)
BUN: 12.9 mg/dL (ref 7.0–26.0)
CHLORIDE: 106 meq/L (ref 98–109)
CO2: 26 meq/L (ref 22–29)
Calcium: 9.9 mg/dL (ref 8.4–10.4)
Creatinine: 0.6 mg/dL (ref 0.6–1.1)
EGFR: 87 mL/min/{1.73_m2} — AB (ref >90)
Glucose: 95 mg/dl (ref 70–140)
POTASSIUM: 3.9 meq/L (ref 3.5–5.1)
SODIUM: 137 meq/L (ref 136–145)
Total Bilirubin: 0.4 mg/dL (ref 0.20–1.20)
Total Protein: 6.3 g/dL — ABNORMAL LOW (ref 6.4–8.3)

## 2016-09-19 MED ORDER — SODIUM CHLORIDE 0.9 % IJ SOLN
10.0000 mL | INTRAMUSCULAR | Status: DC | PRN
  Administered 2016-09-19: 10 mL via INTRAVENOUS
  Filled 2016-09-19: qty 10

## 2016-09-19 MED ORDER — SODIUM CHLORIDE 0.9% FLUSH
10.0000 mL | INTRAVENOUS | Status: DC | PRN
  Administered 2016-09-19: 10 mL via INTRAVENOUS
  Filled 2016-09-19: qty 10

## 2016-09-19 MED ORDER — HEPARIN SOD (PORK) LOCK FLUSH 100 UNIT/ML IV SOLN
500.0000 [IU] | Freq: Once | INTRAVENOUS | Status: AC
  Administered 2016-09-19: 500 [IU] via INTRAVENOUS
  Filled 2016-09-19: qty 5

## 2016-09-19 NOTE — Progress Notes (Signed)
  [facility] OFFICE PROGRESS NOTE   Diagnosis:  Pancreas cancer  INTERVAL HISTORY:   Ms. Clemon returns as scheduled. She completed cycle 5 gemcitabine/Abraxane 09/05/2016. She denies nausea/vomiting. No mouth sores. No diarrhea. She tends to have a fever for several days following chemotherapy. No significant abdominal pain. Main complaint is alteration in taste.   Objective:  Vital signs in last 24 hours:  Blood pressure (!) 125/49, pulse 74, temperature 98.4 F (36.9 C), temperature source Oral, resp. rate 18, height 5\' 5"  (1.651 m), weight 107 lb 1.6 oz (48.6 kg), SpO2 98 %.    HEENT: No thrush or ulcers. Resp: Lungs clear bilaterally. Cardio: Regular rate and rhythm. GI: Abdomen soft and nontender. No hepatomegaly. No apparent ascites. Vascular: No leg edema.  Skin: No rash.  Port-A-Cath without erythema.    Lab Results:  Lab Results  Component Value Date   WBC 8.5 09/19/2016   HGB 9.7 (L) 09/19/2016   HCT 29.8 (L) 09/19/2016   MCV 96.8 09/19/2016   PLT 290 09/19/2016   NEUTROABS 5.4 09/19/2016    Imaging:  No results found.  Medications: I have reviewed the patient's current medications.  Assessment/Plan: 1. Pancreas cancer, pancreas body/tail mass-FNA biopsy of a left liver lesion by EUS on 06/27/2016 confirmed poorly differential adenocarcinoma (features of adenocarcinoma with a degree of squamous differentiation) ? 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 Cycle 1 gemcitabine/Abraxane 07/11/2016 ? Cycle 2 gemcitabine/Abraxane 07/25/2016 ? Cycle 3 gemcitabine/Abraxane 08/08/2016 ? Cycle 4 gemcitabine/Abraxane 08/22/2016 ? Cycle 5 gemcitabine/Abraxane 09/05/2016 ? Restaging CT scans abdomen/pelvis 09/12/2016 showed significant progression of liver metastases. Primary lesion involving the tail of the pancreas  decreased in size.  2. Left abdominal pain secondary to #1. Improved  3. Anorexia/weight loss  4. Status post evaluation in the emergency department 07/13/2016 for fever and confusion. Urine culture showed enterococcus. She completed a course of amoxicillin.   Disposition: Ms. Brandes appears stable. She has completed 5 cycles of gemcitabine/Abraxane. Recent restaging CT scans show evidence of significant progression in the liver. CT results reviewed with Ms. Oliva and her family at today's visit.  Dr. Benay Spice and I have discussed her case. There is evidence of disease progression on the scan. The current chemotherapy will be discontinued. Dr. Benay Spice recommends a trial of FOLFOX versus supportive care. I reviewed this with Ms. Vallin at today's visit. She understands that no therapy will be curative. We reviewed potential toxicities associated with the chemotherapy including myelosuppression, nausea, hair loss. We reviewed the potential for mouth sores, diarrhea, hand-foot syndrome, rash, conjunctivitis with 5-fluorouracil. We reviewed the neurotoxicity associated with oxaliplatin including cold sensitivity, peripheral neuropathy, acute laryngo-pharyngeal dysesthesia. She was provided with written information on 5 fluorouracil, leucovorin and oxaliplatin. We also discussed a supportive/comfort care approach. Her initial thought is to proceed with a trial of FOLFOX chemotherapy.  She will return for a follow-up visit and cycle 1 FOLFOX on 09/26/2016. She will contact the office in the interim with any problems.  25 minutes were spent face-to-face at today's visit with the majority of that time involved in counseling/coordination of care.  Ned Card ANP/GNP-BC   09/19/2016  10:33 AM

## 2016-09-19 NOTE — Progress Notes (Signed)
Patient cancelled her nutrition appointment and did not reschedule.

## 2016-09-19 NOTE — Progress Notes (Signed)
Oncology Nurse Navigator Documentation  Oncology Nurse Navigator Flowsheets 09/19/2016  Navigator Location [facility]  Referral date to RadOnc/MedOnc -  Navigator Encounter Type Follow-up Appt  Telephone -  Abnormal Finding Date -  Confirmed Diagnosis Date -  Treatment Initiated Date -  Patient Visit Type MedOnc;Follow-up  Treatment Phase Active Tx--changing treatment to FOLFOX to start next week (liver progression)  Barriers/Navigation Needs Education  International aid/development worker for Upcoming  Treatment--FOLFOX regimen and potential side effects  Interventions Education  Referrals -  Coordination of Care -  Education Method Written;Verbal  Support Groups/Services -  Acuity -  Time Spent with Patient 15  Crystal Small is disappointed and angry that she needs to change chemo regimen now. Main complaint is altered taste and thus she does not eat much. Is drinking carnation instant breakfast (strawberry), but food just tastes bad and smells bad to her. Was offered low dose Prednisone, but she declines at this time. She declines to see dietician today, feeling she know what she needs to do. Will see her next week in treatment area.

## 2016-09-20 ENCOUNTER — Other Ambulatory Visit: Payer: Self-pay | Admitting: Oncology

## 2016-09-25 ENCOUNTER — Telehealth: Payer: Self-pay | Admitting: *Deleted

## 2016-09-25 ENCOUNTER — Encounter: Payer: Self-pay | Admitting: Pharmacist

## 2016-09-25 NOTE — Telephone Encounter (Signed)
Left message on voicemail informing daughter we may not be able to schedule chemo for 11/2. There may be availability today, we can work pt in with Dr. Benay Spice prior to infusion. Requested she call office. The other option is to keep 11/2 as scheduled with MD and pt return next week for chemo.

## 2016-09-26 ENCOUNTER — Ambulatory Visit (HOSPITAL_BASED_OUTPATIENT_CLINIC_OR_DEPARTMENT_OTHER): Payer: Medicare Other

## 2016-09-26 ENCOUNTER — Other Ambulatory Visit: Payer: Self-pay | Admitting: *Deleted

## 2016-09-26 ENCOUNTER — Telehealth: Payer: Self-pay | Admitting: Oncology

## 2016-09-26 ENCOUNTER — Ambulatory Visit (HOSPITAL_BASED_OUTPATIENT_CLINIC_OR_DEPARTMENT_OTHER): Payer: Medicare Other | Admitting: Oncology

## 2016-09-26 ENCOUNTER — Other Ambulatory Visit (HOSPITAL_BASED_OUTPATIENT_CLINIC_OR_DEPARTMENT_OTHER): Payer: Medicare Other

## 2016-09-26 DIAGNOSIS — C258 Malignant neoplasm of overlapping sites of pancreas: Secondary | ICD-10-CM | POA: Diagnosis not present

## 2016-09-26 DIAGNOSIS — R109 Unspecified abdominal pain: Secondary | ICD-10-CM | POA: Diagnosis not present

## 2016-09-26 DIAGNOSIS — C251 Malignant neoplasm of body of pancreas: Secondary | ICD-10-CM

## 2016-09-26 DIAGNOSIS — Z95828 Presence of other vascular implants and grafts: Secondary | ICD-10-CM

## 2016-09-26 DIAGNOSIS — Z5111 Encounter for antineoplastic chemotherapy: Secondary | ICD-10-CM

## 2016-09-26 DIAGNOSIS — R630 Anorexia: Secondary | ICD-10-CM

## 2016-09-26 DIAGNOSIS — C787 Secondary malignant neoplasm of liver and intrahepatic bile duct: Secondary | ICD-10-CM

## 2016-09-26 DIAGNOSIS — R634 Abnormal weight loss: Secondary | ICD-10-CM

## 2016-09-26 LAB — URINALYSIS, MICROSCOPIC - CHCC
BILIRUBIN (URINE): NEGATIVE
Glucose: NEGATIVE mg/dL
Ketones: NEGATIVE mg/dL
NITRITE: NEGATIVE
Protein: <30 mg/dL
Specific Gravity, Urine: 1.01 (ref 1.003–1.035)
Urobilinogen, UR: 0.2 mg/dL (ref 0.2–1)
pH: 6 (ref 4.6–8.0)

## 2016-09-26 LAB — CBC WITH DIFFERENTIAL/PLATELET
BASO%: 0.6 % (ref 0.0–2.0)
Basophils Absolute: 0.1 10*3/uL (ref 0.0–0.1)
EOS%: 1.2 % (ref 0.0–7.0)
Eosinophils Absolute: 0.3 10*3/uL (ref 0.0–0.5)
HEMATOCRIT: 29.4 % — AB (ref 34.8–46.6)
HEMOGLOBIN: 9.6 g/dL — AB (ref 11.6–15.9)
LYMPH#: 1 10*3/uL (ref 0.9–3.3)
LYMPH%: 4.1 % — ABNORMAL LOW (ref 14.0–49.7)
MCH: 31.8 pg (ref 25.1–34.0)
MCHC: 32.7 g/dL (ref 31.5–36.0)
MCV: 97.1 fL (ref 79.5–101.0)
MONO#: 2.6 10*3/uL — ABNORMAL HIGH (ref 0.1–0.9)
MONO%: 11.2 % (ref 0.0–14.0)
NEUT#: 19.4 10*3/uL — ABNORMAL HIGH (ref 1.5–6.5)
NEUT%: 82.9 % — ABNORMAL HIGH (ref 38.4–76.8)
Platelets: 242 10*3/uL (ref 145–400)
RBC: 3.02 10*6/uL — ABNORMAL LOW (ref 3.70–5.45)
RDW: 17.3 % — AB (ref 11.2–14.5)
WBC: 23.4 10*3/uL — ABNORMAL HIGH (ref 3.9–10.3)

## 2016-09-26 LAB — COMPREHENSIVE METABOLIC PANEL
ALBUMIN: 2.6 g/dL — AB (ref 3.5–5.0)
ALT: 47 U/L (ref 0–55)
AST: 42 U/L — AB (ref 5–34)
Alkaline Phosphatase: 124 U/L (ref 40–150)
Anion Gap: 6 mEq/L (ref 3–11)
BUN: 17.9 mg/dL (ref 7.0–26.0)
CALCIUM: 10.7 mg/dL — AB (ref 8.4–10.4)
CHLORIDE: 100 meq/L (ref 98–109)
CO2: 28 mEq/L (ref 22–29)
CREATININE: 0.6 mg/dL (ref 0.6–1.1)
EGFR: 85 mL/min/{1.73_m2} — ABNORMAL LOW (ref >90)
GLUCOSE: 153 mg/dL — AB (ref 70–140)
POTASSIUM: 3.2 meq/L — AB (ref 3.5–5.1)
SODIUM: 134 meq/L — AB (ref 136–145)
Total Bilirubin: 0.73 mg/dL (ref 0.20–1.20)
Total Protein: 6.1 g/dL — ABNORMAL LOW (ref 6.4–8.3)

## 2016-09-26 MED ORDER — ZOLEDRONIC ACID 4 MG/100ML IV SOLN
4.0000 mg | Freq: Once | INTRAVENOUS | Status: AC
  Administered 2016-09-26: 4 mg via INTRAVENOUS
  Filled 2016-09-26: qty 100

## 2016-09-26 MED ORDER — OXALIPLATIN CHEMO INJECTION 100 MG/20ML
85.0000 mg/m2 | Freq: Once | INTRAVENOUS | Status: AC
  Administered 2016-09-26: 125 mg via INTRAVENOUS
  Filled 2016-09-26: qty 20

## 2016-09-26 MED ORDER — DEXAMETHASONE SODIUM PHOSPHATE 10 MG/ML IJ SOLN
INTRAMUSCULAR | Status: AC
  Filled 2016-09-26: qty 1

## 2016-09-26 MED ORDER — FLUOROURACIL CHEMO INJECTION 2.5 GM/50ML
400.0000 mg/m2 | Freq: Once | INTRAVENOUS | Status: AC
  Administered 2016-09-26: 600 mg via INTRAVENOUS
  Filled 2016-09-26: qty 12

## 2016-09-26 MED ORDER — DEXTROSE 5 % IV SOLN
Freq: Once | INTRAVENOUS | Status: AC
  Administered 2016-09-26: 12:00:00 via INTRAVENOUS

## 2016-09-26 MED ORDER — POTASSIUM CHLORIDE CRYS ER 20 MEQ PO TBCR
20.0000 meq | EXTENDED_RELEASE_TABLET | Freq: Two times a day (BID) | ORAL | 0 refills | Status: DC
Start: 2016-09-26 — End: 2016-10-10

## 2016-09-26 MED ORDER — DEXTROSE 5 % IV SOLN
400.0000 mg/m2 | Freq: Once | INTRAVENOUS | Status: AC
  Administered 2016-09-26: 596 mg via INTRAVENOUS
  Filled 2016-09-26: qty 29.8

## 2016-09-26 MED ORDER — HYDROCODONE-ACETAMINOPHEN 5-325 MG PO TABS: 1.0000 | ORAL_TABLET | ORAL | 0 refills | Status: DC | PRN

## 2016-09-26 MED ORDER — SODIUM CHLORIDE 0.9 % IV SOLN
2400.0000 mg/m2 | INTRAVENOUS | Status: DC
  Administered 2016-09-26: 3600 mg via INTRAVENOUS
  Filled 2016-09-26: qty 72

## 2016-09-26 MED ORDER — HEPARIN SOD (PORK) LOCK FLUSH 100 UNIT/ML IV SOLN
500.0000 [IU] | Freq: Once | INTRAVENOUS | Status: DC | PRN
  Filled 2016-09-26: qty 5

## 2016-09-26 MED ORDER — ALTEPLASE 2 MG IJ SOLR
2.0000 mg | Freq: Once | INTRAMUSCULAR | Status: DC | PRN
  Filled 2016-09-26: qty 2

## 2016-09-26 MED ORDER — PALONOSETRON HCL INJECTION 0.25 MG/5ML
0.2500 mg | Freq: Once | INTRAVENOUS | Status: AC
  Administered 2016-09-26: 0.25 mg via INTRAVENOUS

## 2016-09-26 MED ORDER — SODIUM CHLORIDE 0.9 % IJ SOLN
10.0000 mL | INTRAMUSCULAR | Status: DC | PRN
  Administered 2016-09-26: 10 mL via INTRAVENOUS
  Filled 2016-09-26: qty 10

## 2016-09-26 MED ORDER — LIDOCAINE-PRILOCAINE 2.5-2.5 % EX CREA: 1.0000 "application " | TOPICAL_CREAM | CUTANEOUS | 1 refills | Status: AC | PRN

## 2016-09-26 MED ORDER — DEXAMETHASONE SODIUM PHOSPHATE 10 MG/ML IJ SOLN
10.0000 mg | Freq: Once | INTRAMUSCULAR | Status: AC
  Administered 2016-09-26: 10 mg via INTRAVENOUS

## 2016-09-26 MED ORDER — CIPROFLOXACIN HCL 250 MG PO TABS
250.0000 mg | ORAL_TABLET | Freq: Two times a day (BID) | ORAL | 0 refills | Status: DC
Start: 2016-09-26 — End: 2016-09-30

## 2016-09-26 MED ORDER — SODIUM CHLORIDE 0.9 % IV SOLN
Freq: Once | INTRAVENOUS | Status: AC
  Administered 2016-09-26: 11:00:00 via INTRAVENOUS

## 2016-09-26 MED ORDER — PROCHLORPERAZINE MALEATE 5 MG PO TABS: 5.0000 mg | ORAL_TABLET | Freq: Four times a day (QID) | ORAL | 0 refills | Status: AC | PRN

## 2016-09-26 NOTE — Telephone Encounter (Signed)
Message sent to chemo scheduler to be added for 10/10/16. Per Chemo scheduler, patient will pick up AVS report and appointment schedule in Infusion area today. Appointments scheduled per 09/26/16 los.

## 2016-09-26 NOTE — Telephone Encounter (Signed)
Per Dr. Benay Spice: UA shows infection. Start Cipro 250 mg BID. Joni, Infusion RN made aware. She will inform pt and daughter.

## 2016-09-26 NOTE — Patient Instructions (Signed)
[facility] Discharge Instructions for Patients Receiving Chemotherapy  Today you received the following chemotherapy agents:  Oxaliplatin, Leucovorin, and Adrucil.  To help prevent nausea and vomiting after your treatment, we encourage you to take your compazine nausea medication as directed.  May take Zofran after 3 days for nausea after treatment. If you develop nausea and vomiting that is not controlled by your nausea medication, call the clinic.   BELOW ARE SYMPTOMS THAT SHOULD BE REPORTED IMMEDIATELY:  *FEVER GREATER THAN 100.5 F  *CHILLS WITH OR WITHOUT FEVER  NAUSEA AND VOMITING THAT IS NOT CONTROLLED WITH YOUR NAUSEA MEDICATION  *UNUSUAL SHORTNESS OF BREATH  *UNUSUAL BRUISING OR BLEEDING  TENDERNESS IN MOUTH AND THROAT WITH OR WITHOUT PRESENCE OF ULCERS  *URINARY PROBLEMS  *BOWEL PROBLEMS  UNUSUAL RASH Items with * indicate a potential emergency and should be followed up as soon as possible.  Feel free to call the clinic you have any questions or concerns. The clinic phone number is [phone].  Please show the [facility] at check-in to the Emergency Department and triage nurse.  Fluorouracil, 5-FU injection What is this medicine? FLUOROURACIL, 5-FU (flure oh YOOR a sil) is a chemotherapy drug. It slows the growth of cancer cells. This medicine is used to treat many types of cancer like breast cancer, colon or rectal cancer, pancreatic cancer, and stomach cancer. This medicine may be used for other purposes; ask your health care provider or pharmacist if you have questions. What should I tell my health care provider before I take this medicine? They need to know if you have any of these conditions: -blood disorders -dihydropyrimidine dehydrogenase (DPD) deficiency -infection (especially a virus infection such as chickenpox, cold sores, or herpes) -kidney disease -liver disease -malnourished, poor nutrition -recent or ongoing radiation  therapy -an unusual or allergic reaction to fluorouracil, other chemotherapy, other medicines, foods, dyes, or preservatives -pregnant or trying to get pregnant -breast-feeding How should I use this medicine? This drug is given as an infusion or injection into a vein. It is administered in a hospital or clinic by a specially trained health care professional. Talk to your pediatrician regarding the use of this medicine in children. Special care may be needed. Overdosage: If you think you have taken too much of this medicine contact a poison control center or emergency room at once. NOTE: This medicine is only for you. Do not share this medicine with others. What if I miss a dose? It is important not to miss your dose. Call your doctor or health care professional if you are unable to keep an appointment. What may interact with this medicine? -allopurinol -cimetidine -dapsone -digoxin -hydroxyurea -leucovorin -levamisole -medicines for seizures like ethotoin, fosphenytoin, phenytoin -medicines to increase blood counts like filgrastim, pegfilgrastim, sargramostim -medicines that treat or prevent blood clots like warfarin, enoxaparin, and dalteparin -methotrexate -metronidazole -pyrimethamine -some other chemotherapy drugs like busulfan, cisplatin, estramustine, vinblastine -trimethoprim -trimetrexate -vaccines Talk to your doctor or health care professional before taking any of these medicines: -acetaminophen -aspirin -ibuprofen -ketoprofen -naproxen This list may not describe all possible interactions. Give your health care provider a list of all the medicines, herbs, non-prescription drugs, or dietary supplements you use. Also tell them if you smoke, drink alcohol, or use illegal drugs. Some items may interact with your medicine. What should I watch for while using this medicine? Visit your doctor for checks on your progress. This drug may make you feel generally unwell. This is  not uncommon, as  chemotherapy can affect healthy cells as well as cancer cells. Report any side effects. Continue your course of treatment even though you feel ill unless your doctor tells you to stop. In some cases, you may be given additional medicines to help with side effects. Follow all directions for their use. Call your doctor or health care professional for advice if you get a fever, chills or sore throat, or other symptoms of a cold or flu. Do not treat yourself. This drug decreases your body's ability to fight infections. Try to avoid being around people who are sick. This medicine may increase your risk to bruise or bleed. Call your doctor or health care professional if you notice any unusual bleeding. Be careful brushing and flossing your teeth or using a toothpick because you may get an infection or bleed more easily. If you have any dental work done, tell your dentist you are receiving this medicine. Avoid taking products that contain aspirin, acetaminophen, ibuprofen, naproxen, or ketoprofen unless instructed by your doctor. These medicines may hide a fever. Do not become pregnant while taking this medicine. Women should inform their doctor if they wish to become pregnant or think they might be pregnant. There is a potential for serious side effects to an unborn child. Talk to your health care professional or pharmacist for more information. Do not breast-feed an infant while taking this medicine. Men should inform their doctor if they wish to father a child. This medicine may lower sperm counts. Do not treat diarrhea with over the counter products. Contact your doctor if you have diarrhea that lasts more than 2 days or if it is severe and watery. This medicine can make you more sensitive to the sun. Keep out of the sun. If you cannot avoid being in the sun, wear protective clothing and use sunscreen. Do not use sun lamps or tanning beds/booths. What side effects may I notice from receiving  this medicine? Side effects that you should report to your doctor or health care professional as soon as possible: -allergic reactions like skin rash, itching or hives, swelling of the face, lips, or tongue -low blood counts - this medicine may decrease the number of white blood cells, red blood cells and platelets. You may be at increased risk for infections and bleeding. -signs of infection - fever or chills, cough, sore throat, pain or difficulty passing urine -signs of decreased platelets or bleeding - bruising, pinpoint red spots on the skin, black, tarry stools, blood in the urine -signs of decreased red blood cells - unusually weak or tired, fainting spells, lightheadedness -breathing problems -changes in vision -chest pain -mouth sores -nausea and vomiting -pain, swelling, redness at site where injected -pain, tingling, numbness in the hands or feet -redness, swelling, or sores on hands or feet -stomach pain -unusual bleeding Side effects that usually do not require medical attention (report to your doctor or health care professional if they continue or are bothersome): -changes in finger or toe nails -diarrhea -dry or itchy skin -hair loss -headache -loss of appetite -sensitivity of eyes to the light -stomach upset -unusually teary eyes This list may not describe all possible side effects. Call your doctor for medical advice about side effects. You may report side effects to FDA at 1-800-FDA-1088. Where should I keep my medicine? This drug is given in a hospital or clinic and will not be stored at home. NOTE: This sheet is a summary. It may not cover all possible information. If you have questions about  this medicine, talk to your doctor, pharmacist, or health care provider.    2016, [employer]. (2008-03-16 13:53:16) Leucovorin injection What is this medicine? LEUCOVORIN (loo koe VOR in) is used to prevent or treat the harmful effects of some medicines. This  medicine is used to treat anemia caused by a low amount of folic acid in the body. It is also used with 5-fluorouracil (5-FU) to treat colon cancer. This medicine may be used for other purposes; ask your health care provider or pharmacist if you have questions. What should I tell my health care provider before I take this medicine? They need to know if you have any of these conditions: -anemia from low levels of vitamin B-12 in the blood -an unusual or allergic reaction to leucovorin, folic acid, other medicines, foods, dyes, or preservatives -pregnant or trying to get pregnant -breast-feeding How should I use this medicine? This medicine is for injection into a muscle or into a vein. It is given by a health care professional in a hospital or clinic setting. Talk to your pediatrician regarding the use of this medicine in children. Special care may be needed. Overdosage: If you think you have taken too much of this medicine contact a poison control center or emergency room at once. NOTE: This medicine is only for you. Do not share this medicine with others. What if I miss a dose? This does not apply. What may interact with this medicine? -capecitabine -fluorouracil -phenobarbital -phenytoin -primidone -trimethoprim-sulfamethoxazole This list may not describe all possible interactions. Give your health care provider a list of all the medicines, herbs, non-prescription drugs, or dietary supplements you use. Also tell them if you smoke, drink alcohol, or use illegal drugs. Some items may interact with your medicine. What should I watch for while using this medicine? Your condition will be monitored carefully while you are receiving this medicine. This medicine may increase the side effects of 5-fluorouracil, 5-FU. Tell your doctor or health care professional if you have diarrhea or mouth sores that do not get better or that get worse. What side effects may I notice from receiving this  medicine? Side effects that you should report to your doctor or health care professional as soon as possible: -allergic reactions like skin rash, itching or hives, swelling of the face, lips, or tongue -breathing problems -fever, infection -mouth sores -unusual bleeding or bruising -unusually weak or tired Side effects that usually do not require medical attention (report to your doctor or health care professional if they continue or are bothersome): -constipation or diarrhea -loss of appetite -nausea, vomiting This list may not describe all possible side effects. Call your doctor for medical advice about side effects. You may report side effects to FDA at 1-800-FDA-1088. Where should I keep my medicine? This drug is given in a hospital or clinic and will not be stored at home. NOTE: This sheet is a summary. It may not cover all possible information. If you have questions about this medicine, talk to your doctor, pharmacist, or health care provider.    2016, [employer]. (2008-05-17 16:50:29) Oxaliplatin Injection What is this medicine? OXALIPLATIN (ox AL i PLA tin) is a chemotherapy drug. It targets fast dividing cells, like cancer cells, and causes these cells to die. This medicine is used to treat cancers of the colon and rectum, and many other cancers. This medicine may be used for other purposes; ask your health care provider or pharmacist if you have questions. What should I tell my health care  provider before I take this medicine? They need to know if you have any of these conditions: -kidney disease -an unusual or allergic reaction to oxaliplatin, other chemotherapy, other medicines, foods, dyes, or preservatives -pregnant or trying to get pregnant -breast-feeding How should I use this medicine? This drug is given as an infusion into a vein. It is administered in a hospital or clinic by a specially trained health care professional. Talk to your pediatrician regarding  the use of this medicine in children. Special care may be needed. Overdosage: If you think you have taken too much of this medicine contact a poison control center or emergency room at once. NOTE: This medicine is only for you. Do not share this medicine with others. What if I miss a dose? It is important not to miss a dose. Call your doctor or health care professional if you are unable to keep an appointment. What may interact with this medicine? -medicines to increase blood counts like filgrastim, pegfilgrastim, sargramostim -probenecid -some antibiotics like amikacin, gentamicin, neomycin, polymyxin B, streptomycin, tobramycin -zalcitabine Talk to your doctor or health care professional before taking any of these medicines: -acetaminophen -aspirin -ibuprofen -ketoprofen -naproxen This list may not describe all possible interactions. Give your health care provider a list of all the medicines, herbs, non-prescription drugs, or dietary supplements you use. Also tell them if you smoke, drink alcohol, or use illegal drugs. Some items may interact with your medicine. What should I watch for while using this medicine? Your condition will be monitored carefully while you are receiving this medicine. You will need important blood work done while you are taking this medicine. This medicine can make you more sensitive to cold. Do not drink cold drinks or use ice. Cover exposed skin before coming in contact with cold temperatures or cold objects. When out in cold weather wear warm clothing and cover your mouth and nose to warm the air that goes into your lungs. Tell your doctor if you get sensitive to the cold. This drug may make you feel generally unwell. This is not uncommon, as chemotherapy can affect healthy cells as well as cancer cells. Report any side effects. Continue your course of treatment even though you feel ill unless your doctor tells you to stop. In some cases, you may be given additional  medicines to help with side effects. Follow all directions for their use. Call your doctor or health care professional for advice if you get a fever, chills or sore throat, or other symptoms of a cold or flu. Do not treat yourself. This drug decreases your body's ability to fight infections. Try to avoid being around people who are sick. This medicine may increase your risk to bruise or bleed. Call your doctor or health care professional if you notice any unusual bleeding. Be careful brushing and flossing your teeth or using a toothpick because you may get an infection or bleed more easily. If you have any dental work done, tell your dentist you are receiving this medicine. Avoid taking products that contain aspirin, acetaminophen, ibuprofen, naproxen, or ketoprofen unless instructed by your doctor. These medicines may hide a fever. Do not become pregnant while taking this medicine. Women should inform their doctor if they wish to become pregnant or think they might be pregnant. There is a potential for serious side effects to an unborn child. Talk to your health care professional or pharmacist for more information. Do not breast-feed an infant while taking this medicine. Call your doctor or health  care professional if you get diarrhea. Do not treat yourself. What side effects may I notice from receiving this medicine? Side effects that you should report to your doctor or health care professional as soon as possible: -allergic reactions like skin rash, itching or hives, swelling of the face, lips, or tongue -low blood counts - This drug may decrease the number of white blood cells, red blood cells and platelets. You may be at increased risk for infections and bleeding. -signs of infection - fever or chills, cough, sore throat, pain or difficulty passing urine -signs of decreased platelets or bleeding - bruising, pinpoint red spots on the skin, black, tarry stools, nosebleeds -signs of decreased red  blood cells - unusually weak or tired, fainting spells, lightheadedness -breathing problems -chest pain, pressure -cough -diarrhea -jaw tightness -mouth sores -nausea and vomiting -pain, swelling, redness or irritation at the injection site -pain, tingling, numbness in the hands or feet -problems with balance, talking, walking -redness, blistering, peeling or loosening of the skin, including inside the mouth -trouble passing urine or change in the amount of urine Side effects that usually do not require medical attention (report to your doctor or health care professional if they continue or are bothersome): -changes in vision -constipation -hair loss -loss of appetite -metallic taste in the mouth or changes in taste -stomach pain This list may not describe all possible side effects. Call your doctor for medical advice about side effects. You may report side effects to FDA at 1-800-FDA-1088. Where should I keep my medicine? This drug is given in a hospital or clinic and will not be stored at home. NOTE: This sheet is a summary. It may not cover all possible information. If you have questions about this medicine, talk to your doctor, pharmacist, or health care provider.    2016, [employer] Standard. (2008-06-07 17:22:47) Zoledronic Acid injection (Hypercalcemia, Oncology) What is this medicine? ZOLEDRONIC ACID (ZOE le dron ik AS id) lowers the amount of calcium loss from bone. It is used to treat too much calcium in your blood from cancer. It is also used to prevent complications of cancer that has spread to the bone. This medicine may be used for other purposes; ask your health care provider or pharmacist if you have questions. What should I tell my health care provider before I take this medicine? They need to know if you have any of these conditions: -aspirin-sensitive asthma -cancer, especially if you are receiving medicines used to treat cancer -dental disease or wear  dentures -infection -kidney disease -receiving corticosteroids like dexamethasone or prednisone -an unusual or allergic reaction to zoledronic acid, other medicines, foods, dyes, or preservatives -pregnant or trying to get pregnant -breast-feeding How should I use this medicine? This medicine is for infusion into a vein. It is given by a health care professional in a hospital or clinic setting. Talk to your pediatrician regarding the use of this medicine in children. Special care may be needed. Overdosage: If you think you have taken too much of this medicine contact a poison control center or emergency room at once. NOTE: This medicine is only for you. Do not share this medicine with others. What if I miss a dose? It is important not to miss your dose. Call your doctor or health care professional if you are unable to keep an appointment. What may interact with this medicine? -certain antibiotics given by injection -NSAIDs, medicines for pain and inflammation, like ibuprofen or naproxen -some diuretics like bumetanide, furosemide -teriparatide -thalidomide This  list may not describe all possible interactions. Give your health care provider a list of all the medicines, herbs, non-prescription drugs, or dietary supplements you use. Also tell them if you smoke, drink alcohol, or use illegal drugs. Some items may interact with your medicine. What should I watch for while using this medicine? Visit your doctor or health care professional for regular checkups. It may be some time before you see the benefit from this medicine. Do not stop taking your medicine unless your doctor tells you to. Your doctor may order blood tests or other tests to see how you are doing. Women should inform their doctor if they wish to become pregnant or think they might be pregnant. There is a potential for serious side effects to an unborn child. Talk to your health care professional or pharmacist for more  information. You should make sure that you get enough calcium and vitamin D while you are taking this medicine. Discuss the foods you eat and the vitamins you take with your health care professional. Some people who take this medicine have severe bone, joint, and/or muscle pain. This medicine may also increase your risk for jaw problems or a broken thigh bone. Tell your doctor right away if you have severe pain in your jaw, bones, joints, or muscles. Tell your doctor if you have any pain that does not go away or that gets worse. Tell your dentist and dental surgeon that you are taking this medicine. You should not have major dental surgery while on this medicine. See your dentist to have a dental exam and fix any dental problems before starting this medicine. Take good care of your teeth while on this medicine. Make sure you see your dentist for regular follow-up appointments. What side effects may I notice from receiving this medicine? Side effects that you should report to your doctor or health care professional as soon as possible: -allergic reactions like skin rash, itching or hives, swelling of the face, lips, or tongue -anxiety, confusion, or depression -breathing problems -changes in vision -eye pain -feeling faint or lightheaded, falls -jaw pain, especially after dental work -mouth sores -muscle cramps, stiffness, or weakness -redness, blistering, peeling or loosening of the skin, including inside the mouth -trouble passing urine or change in the amount of urine Side effects that usually do not require medical attention (report to your doctor or health care professional if they continue or are bothersome): -bone, joint, or muscle pain -constipation -diarrhea -fever -hair loss -irritation at site where injected -loss of appetite -nausea, vomiting -stomach upset -trouble sleeping -trouble swallowing -weak or tired This list may not describe all possible side effects. Call your  doctor for medical advice about side effects. You may report side effects to FDA at 1-800-FDA-1088. Where should I keep my medicine? This drug is given in a hospital or clinic and will not be stored at home. NOTE: This sheet is a summary. It may not cover all possible information. If you have questions about this medicine, talk to your doctor, pharmacist, or health care provider.    2016, [employer] Standard. (2014-04-09 14:19:39)

## 2016-09-26 NOTE — Progress Notes (Addendum)
  [facility] OFFICE PROGRESS NOTE   Diagnosis: Pancreas cancer  INTERVAL HISTORY:   Ms. Noren returns as scheduled. She reports night sweats and discomfort at the right costal margin. Her appetite is poor. She reviewed the restaging CT results with Ned Card last week. She is here to begin FOLFOX chemotherapy. She reports episodes of urinary incontinence at night. Her daughter reports she has been confused at night.  Objective:  Vital signs in last 24 hours:  Blood pressure (!) 114/50, pulse 82, temperature 98.3 F (36.8 C), temperature source Oral, resp. rate 17, height 5\' 5"  (1.651 m), weight 106 lb 14.4 oz (48.5 kg), SpO2 98 %.    HEENT: [place] over the tongue, no buccal thrush Resp: Lungs clear bilaterally Cardio: Regular rate and rhythm GI: No hepatosplenomegaly, nontender Vascular: No leg edema  Portacath/PICC-without erythema  Lab Results:  Lab Results  Component Value Date   WBC 23.4 (H) 09/26/2016   HGB 9.6 (L) 09/26/2016   HCT 29.4 (L) 09/26/2016   MCV 97.1 09/26/2016   PLT 242 09/26/2016   NEUTROABS 19.4 (H) 09/26/2016   Calcium 10.7, albumin 2.6  Medications: I have reviewed the patient's current medications.  Assessment/Plan: 1. 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 Primary lesion involving  the tail of the pancreas decreased in size.  Cycle 1 FOLFOX 09/26/2016  2. Left abdominal pain secondary to #1. Improved  3. Anorexia/weight loss  4. Status post evaluation in the emergency department 07/13/2016 for fever and confusion. Urine culture showed enterococcus. She completed a course of amoxicillin. 5.   Hypercalcemia, status post Zometa 09/26/2016    Disposition:  Ms. Biffle has metastatic pancreas cancer. The plan is to begin second line chemotherapy with FOLFOX today. I reviewed the potential toxicities associated with this regimen with Ms. Boisselle and her daughter today. She agrees to proceed.  She has hypercalcemia. She will be treated with Zometa. We reviewed the potential toxicities associated with Zometa. She will be sure she is up-to-date on dental care. She agrees to proceed.  Ms. Sandstedt will return for an office visit and chemotherapy in 2 weeks. I suspect the right costal discomfort is related to liver metastases.  [name], MD  09/26/2016  10:47 AM

## 2016-09-27 ENCOUNTER — Telehealth: Payer: Self-pay | Admitting: *Deleted

## 2016-09-27 LAB — URINE CULTURE

## 2016-09-27 LAB — CANCER ANTIGEN 19-9: CA 19-9: 4 U/mL (ref 0–35)

## 2016-09-27 NOTE — Telephone Encounter (Signed)
Per LOS I have tried to scheduled appts , no available waiting for more instructions

## 2016-09-27 NOTE — Telephone Encounter (Signed)
Pt denies any nausea/vomiting. Side effects and management reviewed. Pt stated she will take stool softener today and Miralax on 11/4 if needed. Instructed her to push PO fluids.  Per pt: fanny pack is too big and uncomfortable but she'll be "rid of it tomorrow." Reviewed on call contact info and Sat appt instructions. She voiced understanding.

## 2016-09-28 ENCOUNTER — Emergency Department (HOSPITAL_COMMUNITY): Payer: Medicare Other

## 2016-09-28 ENCOUNTER — Encounter (HOSPITAL_COMMUNITY): Payer: Self-pay | Admitting: Emergency Medicine

## 2016-09-28 ENCOUNTER — Inpatient Hospital Stay (HOSPITAL_COMMUNITY)
Admission: EM | Admit: 2016-09-28 | Discharge: 2016-09-30 | DRG: 872 | Disposition: A | Discharge status: Home / Self Care | Payer: Medicare Other | Attending: Internal Medicine | Admitting: Internal Medicine

## 2016-09-28 DIAGNOSIS — C251 Malignant neoplasm of body of pancreas: Secondary | ICD-10-CM | POA: Diagnosis present

## 2016-09-28 DIAGNOSIS — A419 Sepsis, unspecified organism: Principal | ICD-10-CM | POA: Diagnosis present

## 2016-09-28 DIAGNOSIS — E785 Hyperlipidemia, unspecified: Secondary | ICD-10-CM | POA: Diagnosis present

## 2016-09-28 DIAGNOSIS — N390 Urinary tract infection, site not specified: Secondary | ICD-10-CM | POA: Diagnosis present

## 2016-09-28 DIAGNOSIS — Z79899 Other long term (current) drug therapy: Secondary | ICD-10-CM

## 2016-09-28 DIAGNOSIS — E43 Unspecified severe protein-calorie malnutrition: Secondary | ICD-10-CM | POA: Insufficient documentation

## 2016-09-28 DIAGNOSIS — B379 Candidiasis, unspecified: Secondary | ICD-10-CM | POA: Diagnosis present

## 2016-09-28 DIAGNOSIS — R531 Weakness: Secondary | ICD-10-CM | POA: Diagnosis not present

## 2016-09-28 DIAGNOSIS — R509 Fever, unspecified: Secondary | ICD-10-CM | POA: Diagnosis present

## 2016-09-28 DIAGNOSIS — C787 Secondary malignant neoplasm of liver and intrahepatic bile duct: Secondary | ICD-10-CM | POA: Diagnosis present

## 2016-09-28 DIAGNOSIS — Z95828 Presence of other vascular implants and grafts: Secondary | ICD-10-CM | POA: Diagnosis not present

## 2016-09-28 DIAGNOSIS — D72829 Elevated white blood cell count, unspecified: Secondary | ICD-10-CM

## 2016-09-28 DIAGNOSIS — K219 Gastro-esophageal reflux disease without esophagitis: Secondary | ICD-10-CM | POA: Diagnosis present

## 2016-09-28 LAB — COMPREHENSIVE METABOLIC PANEL
ALBUMIN: 2.9 g/dL — AB (ref 3.5–5.0)
ALK PHOS: 101 U/L (ref 38–126)
ALT: 61 U/L — AB (ref 14–54)
AST: 55 U/L — AB (ref 15–41)
Anion gap: 7 (ref 5–15)
BILIRUBIN TOTAL: 0.9 mg/dL (ref 0.3–1.2)
BUN: 31 mg/dL — AB (ref 6–20)
CALCIUM: 9.4 mg/dL (ref 8.9–10.3)
CO2: 27 mmol/L (ref 22–32)
CREATININE: 0.61 mg/dL (ref 0.44–1.00)
Chloride: 101 mmol/L (ref 101–111)
GFR calc Af Amer: >60 mL/min (ref >60)
GLUCOSE: 157 mg/dL — AB (ref 65–99)
Potassium: 3.6 mmol/L (ref 3.5–5.1)
Sodium: 135 mmol/L (ref 135–145)
TOTAL PROTEIN: 6 g/dL — AB (ref 6.5–8.1)

## 2016-09-28 LAB — CBC WITH DIFFERENTIAL/PLATELET
BASOS ABS: 0 10*3/uL (ref 0.0–0.1)
BASOS PCT: 0 %
Eosinophils Absolute: 0.6 10*3/uL (ref 0.0–0.7)
Eosinophils Relative: 2 %
HEMATOCRIT: 28.4 % — AB (ref 36.0–46.0)
HEMOGLOBIN: 9.6 g/dL — AB (ref 12.0–15.0)
LYMPHS PCT: 3 %
Lymphs Abs: 0.9 10*3/uL (ref 0.7–4.0)
MCH: 32 pg (ref 26.0–34.0)
MCHC: 33.8 g/dL (ref 30.0–36.0)
MCV: 94.7 fL (ref 78.0–100.0)
MONOS PCT: 2 %
Monocytes Absolute: 0.6 10*3/uL (ref 0.1–1.0)
NEUTROS ABS: 26.9 10*3/uL — AB (ref 1.7–7.7)
NEUTROS PCT: 93 %
Platelets: 257 10*3/uL (ref 150–400)
RBC: 3 MIL/uL — ABNORMAL LOW (ref 3.87–5.11)
RDW: 16.5 % — ABNORMAL HIGH (ref 11.5–15.5)
WBC: 29 10*3/uL — ABNORMAL HIGH (ref 4.0–10.5)

## 2016-09-28 LAB — URINALYSIS, ROUTINE W REFLEX MICROSCOPIC
BILIRUBIN URINE: NEGATIVE
GLUCOSE, UA: NEGATIVE mg/dL
HGB URINE DIPSTICK: NEGATIVE
Ketones, ur: NEGATIVE mg/dL
Leukocytes, UA: NEGATIVE
Nitrite: NEGATIVE
PROTEIN: NEGATIVE mg/dL
Specific Gravity, Urine: 1.017 (ref 1.005–1.030)
pH: 7 (ref 5.0–8.0)

## 2016-09-28 LAB — I-STAT CHEM 8, ED
BUN: 28 mg/dL — AB (ref 6–20)
CHLORIDE: 98 mmol/L — AB (ref 101–111)
CREATININE: 0.6 mg/dL (ref 0.44–1.00)
Calcium, Ion: 1.29 mmol/L (ref 1.15–1.40)
Glucose, Bld: 154 mg/dL — ABNORMAL HIGH (ref 65–99)
HEMATOCRIT: 32 % — AB (ref 36.0–46.0)
Hemoglobin: 10.9 g/dL — ABNORMAL LOW (ref 12.0–15.0)
Potassium: 3.6 mmol/L (ref 3.5–5.1)
SODIUM: 137 mmol/L (ref 135–145)
TCO2: 27 mmol/L (ref 0–100)

## 2016-09-28 LAB — LIPASE, BLOOD: LIPASE: 51 U/L (ref 11–51)

## 2016-09-28 LAB — I-STAT CG4 LACTIC ACID, ED: Lactic Acid, Venous: 2.5 mmol/L (ref 0.5–1.9)

## 2016-09-28 LAB — TSH: TSH: 1.715 u[IU]/mL (ref 0.350–4.500)

## 2016-09-28 LAB — PROTIME-INR
INR: 1.2
Prothrombin Time: 15.3 seconds — ABNORMAL HIGH (ref 11.4–15.2)

## 2016-09-28 MED ORDER — VANCOMYCIN HCL IN DEXTROSE 1-5 GM/200ML-% IV SOLN
1000.0000 mg | INTRAVENOUS | Status: DC
  Administered 2016-09-28 – 2016-09-30 (×3): 1000 mg via INTRAVENOUS
  Filled 2016-09-28 (×3): qty 200

## 2016-09-28 MED ORDER — HEPARIN SODIUM (PORCINE) 5000 UNIT/ML IJ SOLN
5000.0000 [IU] | Freq: Three times a day (TID) | INTRAMUSCULAR | Status: DC
  Administered 2016-09-28 – 2016-09-30 (×5): 5000 [IU] via SUBCUTANEOUS
  Filled 2016-09-28 (×6): qty 1

## 2016-09-28 MED ORDER — ONDANSETRON HCL 4 MG/2ML IJ SOLN: 4.0000 mg | Freq: Four times a day (QID) | INTRAMUSCULAR | Status: DC | PRN

## 2016-09-28 MED ORDER — SODIUM CHLORIDE 0.9 % IV BOLUS (SEPSIS)
500.0000 mL | Freq: Once | INTRAVENOUS | Status: AC
  Administered 2016-09-28: 500 mL via INTRAVENOUS

## 2016-09-28 MED ORDER — SODIUM CHLORIDE 0.9 % IV BOLUS (SEPSIS)
1000.0000 mL | Freq: Once | INTRAVENOUS | Status: AC
  Administered 2016-09-28: 1000 mL via INTRAVENOUS

## 2016-09-28 MED ORDER — DEXTROSE 5 % IV SOLN
1.0000 g | INTRAVENOUS | Status: DC
  Administered 2016-09-29 – 2016-09-30 (×2): 1 g via INTRAVENOUS
  Filled 2016-09-28 (×3): qty 10

## 2016-09-28 MED ORDER — SODIUM CHLORIDE 0.9 % IV SOLN
INTRAVENOUS | Status: DC
  Administered 2016-09-28: 1000 mL via INTRAVENOUS
  Administered 2016-09-28: 500 mL via INTRAVENOUS
  Administered 2016-09-29 – 2016-09-30 (×4): via INTRAVENOUS

## 2016-09-28 MED ORDER — ALPRAZOLAM 0.25 MG PO TABS
0.2500 mg | ORAL_TABLET | Freq: Two times a day (BID) | ORAL | Status: DC | PRN
  Administered 2016-09-28: 0.25 mg via ORAL
  Filled 2016-09-28: qty 1

## 2016-09-28 MED ORDER — ACETAMINOPHEN 650 MG RE SUPP: 650.0000 mg | Freq: Four times a day (QID) | RECTAL | Status: DC | PRN

## 2016-09-28 MED ORDER — ACETAMINOPHEN 325 MG PO TABS
650.0000 mg | ORAL_TABLET | Freq: Once | ORAL | Status: AC
  Administered 2016-09-28: 650 mg via ORAL
  Filled 2016-09-28: qty 2

## 2016-09-28 MED ORDER — DEXTROSE 5 % IV SOLN
2.0000 g | Freq: Once | INTRAVENOUS | Status: AC
  Administered 2016-09-28: 2 g via INTRAVENOUS
  Filled 2016-09-28: qty 2

## 2016-09-28 MED ORDER — ONDANSETRON HCL 4 MG PO TABS
4.0000 mg | ORAL_TABLET | Freq: Four times a day (QID) | ORAL | Status: DC | PRN
Start: 2016-09-28 — End: 2016-09-30

## 2016-09-28 MED ORDER — HYDROCODONE-ACETAMINOPHEN 5-325 MG PO TABS: 1.0000 | ORAL_TABLET | ORAL | Status: DC | PRN

## 2016-09-28 MED ORDER — ACETAMINOPHEN 325 MG PO TABS
650.0000 mg | ORAL_TABLET | Freq: Four times a day (QID) | ORAL | Status: DC | PRN
  Administered 2016-09-28: 650 mg via ORAL
  Filled 2016-09-28: qty 2

## 2016-09-28 MED ORDER — SODIUM CHLORIDE 0.9% FLUSH
3.0000 mL | Freq: Two times a day (BID) | INTRAVENOUS | Status: DC
  Administered 2016-09-28 – 2016-09-30 (×4): 3 mL via INTRAVENOUS

## 2016-09-28 NOTE — Progress Notes (Signed)
PHARMACY NOTE -  Ceftriaxone  Pharmacy has asked to assist with dosing of Ceftriaxone  for UTI.  Ceftriaxone 2gm IV x 1 has been ordered by provider in the ED.  After this initial dose, will continue with Ceftriaxone 1gm IV q24h.  Anticipate dosage to remain stable and need for further dosage adjustment appears unlikely at present.    Will sign off at this time.  Please reconsult if a change in clinical status warrants re-evaluation of dosage.  Thank you, Leone Haven, PharmD 09/28/16 @ 06:45

## 2016-09-28 NOTE — Plan of Care (Signed)
Problem: Safety: Goal: Ability to remain free from injury will improve Outcome: Progressing Patient able to ambulate to BR with one assist.

## 2016-09-28 NOTE — H&P (Signed)
History and Physical    Crystal Small [id number] DOB: 02-24-1935 DOA: 09/28/2016  PCP: Haywood Pao, MD  Patient coming from: Home  Chief Complaint: Generalized weakness  HPI: Crystal Small is a 81 y.o. female with medical history significant of metastatic pancreatic cancer follows with Dr. Benay Spice as outpatient. She reported that she is having generalized weakness for the past 2 days, she was evaluated on 11/2 by her oncologist to start FOLFOX, reported same symptoms back then and her urine was consistent with UTI, started on ciprofloxacin. She did not feel better and generalized weakness got worse so she came to the hospital for further evaluation, she was found to have fever of 101.1. BCPs of 29 and lactic acid of 2.5  ED Course:  Vitals: Fever of 101.1. Labs: WBC 29, hemoglobin 9.6 and lactic acid of 2.5. Imaging: CXR without acute findings Interventions: Started on Rocephin by ED  Review of Systems:  Constitutional: negative for anorexia, fevers and sweats Eyes: negative for irritation, redness and visual disturbance Ears, nose, mouth, throat, and face: negative for earaches, epistaxis, nasal congestion and sore throat Respiratory: negative for cough, dyspnea on exertion, sputum and wheezing Cardiovascular: negative for chest pain, dyspnea, lower extremity edema, orthopnea, palpitations and syncope Gastrointestinal: negative for abdominal pain, constipation, diarrhea, melena, nausea and vomiting Genitourinary:negative for dysuria, frequency and hematuria Hematologic/lymphatic: negative for bleeding, easy bruising and lymphadenopathy Musculoskeletal:negative for arthralgias, muscle weakness and stiff joints Neurological: negative for coordination problems, gait problems, headaches and weakness Endocrine: negative for diabetic symptoms including polydipsia, polyuria and weight loss Allergic/Immunologic: negative for anaphylaxis, hay fever and urticaria  Past Medical History:    Diagnosis Date  . Cancer [facility])    pancreatic cancer   . GERD (gastroesophageal reflux disease)   . Hemorrhoids   . Hyperlipidemia   . Pancreatic mass     Past Surgical History:  Procedure Laterality Date  . ABDOMINAL HYSTERECTOMY  1990   COMPLETE  . COLONOSCOPY  2006  . EUS N/A 06/27/2016   Procedure: ESOPHAGEAL ENDOSCOPIC ULTRASOUND (EUS) RADIAL;  Surgeon: Milus Banister, MD;  Location: [facility] ENDOSCOPY;  Service: Endoscopy;  Laterality: N/A;  . IR GENERIC HISTORICAL  07/10/2016   IR US GUIDE VASC ACCESS RIGHT 07/10/2016 Jacqulynn Cadet, MD [facility]-INTER[facility] RAD  . IR GENERIC HISTORICAL  07/10/2016   IR FLUORO GUIDE CV LINE RIGHT 07/10/2016 Jacqulynn Cadet, MD [facility]INTER[facility]  . LIVER BIOPSY  06/19/2016   WITH ULTRASOUND  . PORTACATH PLACEMENT       reports that she has never smoked. She has never used smokeless tobacco. She reports that she does not drink alcohol or use drugs.  No Known Allergies  Family History  Problem Relation Age of Onset  . Heart disease Sister   . Heart disease Brother   . Lung cancer Brother    Prior to Admission medications   Medication Sig Start Date End Date Taking? Authorizing Provider  ciprofloxacin (CIPRO) 250 MG tablet Take 1 tablet (250 mg total) by mouth 2 (two) times daily. 09/26/16 09/30/16 Yes Ladell Pier, MD  lidocaine-prilocaine (EMLA) cream Apply 1 application topically as needed (prior to accessing port). 09/26/16  Yes Ladell Pier, MD  omeprazole (PRILOSEC) 40 MG capsule Take 40 mg by mouth daily.    Yes Historical Provider, MD  potassium chloride SA (K-DUR,KLOR-CON) 20 MEQ tablet Take 1 tablet (20 mEq total) by mouth 2 (two) times daily. 09/26/16  Yes Ladell Pier, MD  acetaminophen (TYLENOL) 325 MG tablet Take 325  mg by mouth every 6 (six) hours as needed for fever.    Historical Provider, MD  ALPRAZolam Duanne Moron) 0.25 MG tablet Take 1 tablet (0.25 mg total) by mouth at bedtime as needed for sleep. 08/23/16   Ladell Pier, MD   HYDROcodone-acetaminophen (NORCO/VICODIN) 5-325 MG tablet Take 1 tablet by mouth every 4 (four) hours as needed for moderate pain. 09/26/16   Ladell Pier, MD  ondansetron (ZOFRAN) 8 MG tablet Take 1 tablet (8 mg total) by mouth every 8 (eight) hours as needed for nausea or vomiting. Patient not taking: Reported on 09/28/2016 07/12/16   Ladell Pier, MD  prochlorperazine (COMPAZINE) 5 MG tablet Take 1 tablet (5 mg total) by mouth every 6 (six) hours as needed for nausea or vomiting. 09/26/16   Ladell Pier, MD    Physical Exam:  Vitals:   09/28/16 0730 09/28/16 0750 09/28/16 0800 09/28/16 0827  BP: 122/57  112/55 112/55  Pulse: 96  92 95  Resp:    18  Temp:  98.8 F (37.1 C)    TempSrc:  Oral    SpO2: 93%  92% 95%  Weight:      Height:        Constitutional: NAD, calm, comfortable Eyes: PERRL, lids and conjunctivae normal ENMT: Mucous membranes are moist. Posterior pharynx clear of any exudate or lesions.Normal dentition.  Neck: normal, supple, no masses, no thyromegaly Respiratory: clear to auscultation bilaterally, no wheezing, no crackles. Normal respiratory effort. No accessory muscle use.  Cardiovascular: Regular rate and rhythm, no murmurs / rubs / gallops. No extremity edema. 2+ pedal pulses. No carotid bruits.  Abdomen: no tenderness, no masses palpated. No hepatosplenomegaly. Bowel sounds positive.  Musculoskeletal: no clubbing / cyanosis. No joint deformity upper and lower extremities. Good ROM, no contractures. Normal muscle tone.  Skin: no rashes, lesions, ulcers. No induration Neurologic: CN 2-12 grossly intact. Sensation intact, DTR normal. Strength 5/5 in all 4.  Psychiatric: Normal judgment and insight. Alert and oriented x 3. Normal mood.   Labs on Admission: I have personally reviewed following labs and imaging studies  CBC:  Recent Labs Lab 09/26/16 0906 09/28/16 0617 09/28/16 0632  WBC 23.4* 29.0*  --   NEUTROABS 19.4* 26.9*  --   HGB 9.6* 9.6*  10.9*  HCT 29.4* 28.4* 32.0*  MCV 97.1 94.7  --   PLT 242 257  --    Basic Metabolic Panel:  Recent Labs Lab 09/26/16 0907 09/28/16 0617 09/28/16 0632  NA 134* 135 137  K 3.2* 3.6 3.6  CL  --  101 98*  CO2 28 27  --   GLUCOSE 153* 157* 154*  BUN 17.9 31* 28*  CREATININE 0.6 0.61 0.60  CALCIUM 10.7* 9.4  --    GFR: Estimated Creatinine Clearance: 43.7 mL/min (by C-G formula based on SCr of 0.6 mg/dL). Liver Function Tests:  Recent Labs Lab 09/26/16 0907 09/28/16 0617  AST 42* 55*  ALT 47 61*  ALKPHOS 124 101  BILITOT 0.73 0.9  PROT 6.1* 6.0*  ALBUMIN 2.6* 2.9*    Recent Labs Lab 09/28/16 0617  LIPASE 51   No results for input(s): AMMONIA in the last 168 hours. Coagulation Profile: No results for input(s): INR, PROTIME in the last 168 hours. Cardiac Enzymes: No results for input(s): CKTOTAL, CKMB, CKMBINDEX, TROPONINI in the last 168 hours. BNP (last 3 results) No results for input(s): PROBNP in the last 8760 hours. HbA1C: No results for input(s): HGBA1C in the last 72  hours. CBG: No results for input(s): GLUCAP in the last 168 hours. Lipid Profile: No results for input(s): CHOL, HDL, LDLCALC, TRIG, CHOLHDL, LDLDIRECT in the last 72 hours. Thyroid Function Tests: No results for input(s): TSH, T4TOTAL, FREET4, T3FREE, THYROIDAB in the last 72 hours. Anemia Panel: No results for input(s): VITAMINB12, FOLATE, FERRITIN, TIBC, IRON, RETICCTPCT in the last 72 hours. Urine analysis:    Component Value Date/Time   COLORURINE YELLOW 09/28/2016 0635   APPEARANCEUR CLOUDY (A) 09/28/2016 0635   LABSPEC 1.017 09/28/2016 0635   LABSPEC 1.010 09/26/2016 1132   PHURINE 7.0 09/28/2016 0635   GLUCOSEU NEGATIVE 09/28/2016 0635   GLUCOSEU Negative 09/26/2016 1132   HGBUR NEGATIVE 09/28/2016 0635   BILIRUBINUR NEGATIVE 09/28/2016 0635   BILIRUBINUR Negative 09/26/2016 1132   KETONESUR NEGATIVE 09/28/2016 0635   PROTEINUR NEGATIVE 09/28/2016 0635   UROBILINOGEN 0.2  09/26/2016 1132   NITRITE NEGATIVE 09/28/2016 0635   LEUKOCYTESUR NEGATIVE 09/28/2016 0635   LEUKOCYTESUR Moderate 09/26/2016 1132   Sepsis Labs: !!!!!!!!!!!!!!!!!!!!!!!!!!!!!!!!!!!!!!!!!!!! Invalid input(s): PROCALCITONIN, LACTICIDVEN Recent Results (from the past 240 hour(s))  Urine Culture     Status: None   Collection Time: 09/26/16  1:00 PM  Result Value Ref Range Status   Urine Culture, Routine Final report  Final   Urine Culture result 1 Comment  Final    Comment: Culture shows less than 10,000 colony forming units of bacteria per milliliter of urine. This colony count is not generally considered to be clinically significant.      Radiological Exams on Admission: Dg Chest 2 View  Result Date: 09/28/2016 CLINICAL DATA:  Progressive weakness over the past few days. EXAM: CHEST  2 VIEW COMPARISON:  07/13/2016 FINDINGS: Right jugular port extends to the low SVC. No airspace consolidation. No effusion. Unremarkable mediastinal and cardiac contours. Unchanged T12 compression deformity. IMPRESSION: No active cardiopulmonary disease. Electronically Signed   By: Andreas Newport M.D.   On: 09/28/2016 06:23    EKG: Independently reviewed.   Assessment/Plan Principal Problem:   Weakness generalized Active Problems:   Cancer of pancreas, body ([place])   Port catheter in place   UTI (urinary tract infection)   Fever    Fever and sepsis -Likely early sepsis, fever of 101.1, WBC of 29 and lactic acid of 2.5, recent UTI. -Sepsis protocol started in the ED, this is presumed to be secondary to UTI so Rocephin was started. -I have added vancomycin because history of enterococcus UTI and concurrent chemotherapy. -Follow blood and urine cultures. Has Port-A-Cath, was receiving chemotherapy through a pump.  Metastatic pancreatic cancer -Poorly differentiated adenocarcinoma of the pancreas diagnosed in August 2017. -Undergone 5 cycles of gemcitabine/Abraxane, done on  09/05/2016. -Restaging CT showed progression of liver metastases so started on FOLFOX on 09/26/16.  Generalized weakness -Likely secondary to her fever/sepsis, also dehydration, started on fluids. -No focal neurological findings, but if continues to have symptoms, we will consider MRI of the brain.  Recent UTI -She was recently diagnosed with UTI as outpatient and started on ciprofloxacin, received only 3 doses of it. -Started on Rocephin, in August she had enterococcus UTI so she was started on vancomycin as well.  Abdominal pain -Reported RUQ abdominal pain when she saw Dr. Benay Spice on 11/2, this is improved, this is likely secondary to liver metastases.   DVT prophylaxis: SQ Heparin Code Status: Full code Family Communication: Plan D/W patient Disposition Plan: Home Consults called:  Admission status: Inpatient   [facility] A MD [facility] Pager [phone]  If 7PM-7AM,  please contact night-coverage [email or web address] Password [email or web address]  09/28/2016, 9:22 AM

## 2016-09-28 NOTE — ED Notes (Signed)
Pt.i-stat CG4 Lactic acid 2.50.RN,Lisa made aware and PA,[place].

## 2016-09-28 NOTE — ED Triage Notes (Signed)
Per EMS pt is actively receiving chemo and has a pump on at this time  It is supposed to be discontinued later this morning  Pt has a portacath  Pt's daughter reports the pt has been becoming more and more weak over the past couple of days as to where it is hard for her to even lift her extremities off the bed  Pt has no other complaints other than the weakness  Pt states she has not had any food or meds this morning

## 2016-09-28 NOTE — ED Provider Notes (Signed)
[facility] DEPT Provider Note   CSN: [id number] Arrival date & time: 09/28/16  0518     History   Chief Complaint Chief Complaint  Patient presents with  . Weakness    HPI Crystal Small is a 81 y.o. female.  HPI   81 year old female with history of pancreatic cancer actively receiving chemotherapy was brought here via EMS for evaluation of generalized weakness. Per daughter who is at bedside, patient has been becoming progressively weak for the past several days.  Patient was seen at the oncologist for chemotherapy treatment 3 days ago. Patient's daughter noticed that she is feeling more confused than usual and having urinary incontinence and request for urine to be checked. Pt was told she has a UTI and was started on ciprofloxacin. Since yesterday, patient has been increasingly fatigued, having difficulty getting out of bed. She denies any fever, chills, headache, productive cough, chest pain, shortness of breath, abdominal pain, dysuria, or rash. Dr. Benay Spice is her oncologist.      Past Medical History:  Diagnosis Date  . Cancer [facility])    pancreatic cancer   . GERD (gastroesophageal reflux disease)   . Hemorrhoids   . Hyperlipidemia   . Pancreatic mass     Patient Active Problem List   Diagnosis Date Noted  . Port catheter in place 07/25/2016  . Cancer of pancreas, body ([place]) 07/02/2016    Past Surgical History:  Procedure Laterality Date  . ABDOMINAL HYSTERECTOMY  1990   COMPLETE  . COLONOSCOPY  2006  . EUS N/A 06/27/2016   Procedure: ESOPHAGEAL ENDOSCOPIC ULTRASOUND (EUS) RADIAL;  Surgeon: Milus Banister, MD;  Location: [facility] ENDOSCOPY;  Service: Endoscopy;  Laterality: N/A;  . IR GENERIC HISTORICAL  07/10/2016   IR US GUIDE VASC ACCESS RIGHT 07/10/2016 Jacqulynn Cadet, MD [facility]-INTERV RAD  . IR GENERIC HISTORICAL  07/10/2016   IR FLUORO GUIDE CV LINE RIGHT 07/10/2016 Jacqulynn Cadet, MD [facility]-INTER[facility]  . LIVER BIOPSY  06/19/2016   WITH ULTRASOUND  . PORTACATH  PLACEMENT      OB History    No data available       Home Medications    Prior to Admission medications   Medication Sig Start Date End Date Taking? Authorizing Provider  acetaminophen (TYLENOL) 325 MG tablet Take 325 mg by mouth every 6 (six) hours as needed for fever.    Historical Provider, MD  ALPRAZolam Duanne Moron) 0.25 MG tablet Take 1 tablet (0.25 mg total) by mouth at bedtime as needed for sleep. 08/23/16   Ladell Pier, MD  ciprofloxacin (CIPRO) 250 MG tablet Take 1 tablet (250 mg total) by mouth 2 (two) times daily. 09/26/16 09/30/16  Ladell Pier, MD  HYDROcodone-acetaminophen (NORCO/VICODIN) 5-325 MG tablet Take 1 tablet by mouth every 4 (four) hours as needed for moderate pain. 09/26/16   Ladell Pier, MD  lidocaine-prilocaine (EMLA) cream Apply 1 application topically as needed (prior to accessing port). 09/26/16   Ladell Pier, MD  omeprazole (PRILOSEC) 40 MG capsule Take 40 mg by mouth daily.     Historical Provider, MD  ondansetron (ZOFRAN) 8 MG tablet Take 1 tablet (8 mg total) by mouth every 8 (eight) hours as needed for nausea or vomiting. Patient not taking: Reported on 09/19/2016 07/12/16   Ladell Pier, MD  potassium chloride SA (K-DUR,KLOR-CON) 20 MEQ tablet Take 1 tablet (20 mEq total) by mouth 2 (two) times daily. 09/26/16   Ladell Pier, MD  prochlorperazine (COMPAZINE) 5 MG tablet Take  1 tablet (5 mg total) by mouth every 6 (six) hours as needed for nausea or vomiting. 09/26/16   Ladell Pier, MD    Family History Family History  Problem Relation Age of Onset  . Heart disease Sister   . Heart disease Brother   . Lung cancer Brother     Social History Social History  Substance Use Topics  . Smoking status: Never Smoker  . Smokeless tobacco: Never Used  . Alcohol use No     Allergies   Review of patient's allergies indicates no known allergies.   Review of Systems Review of Systems  All other systems reviewed and are  negative.    Physical Exam Updated Vital Signs BP 132/65 (BP Location: Right Arm)   Pulse 105   Temp 101.1 F (38.4 C) (Oral)   Resp 20   Ht 5\' 5"  (1.651 m)   Wt 48.5 kg   SpO2 97%   BMI 17.81 kg/m   Physical Exam  Constitutional: She is oriented to person, place, and time. No distress.  Elderly female, ill appearing  HENT:  Head: Atraumatic.  Mouth is dry  Eyes: Conjunctivae are normal.  Neck: Neck supple.  No nuchal rigidity  Cardiovascular: Intact distal pulses.   Tachycardia without murmur rubs or gallops  Pulmonary/Chest: Effort normal and breath sounds normal.  Port-A-Cath on right chest with normal appearance  Abdominal: Soft. Bowel sounds are normal.  Musculoskeletal: She exhibits no edema.  4 out 5 strength all 4 extremities with poor effort  Neurological: She is alert and oriented to person, place, and time.  Skin: Skin is warm. No rash noted.  Psychiatric: She has a normal mood and affect.  Nursing note and vitals reviewed.    ED Treatments / Results  Labs (all labs ordered are listed, but only abnormal results are displayed) Labs Reviewed  CBC WITH DIFFERENTIAL/PLATELET - Abnormal; Notable for the following:       Result Value   WBC 29.0 (*)    RBC 3.00 (*)    Hemoglobin 9.6 (*)    HCT 28.4 (*)    RDW 16.5 (*)    Neutro Abs 26.9 (*)    All other components within normal limits  COMPREHENSIVE METABOLIC PANEL - Abnormal; Notable for the following:    Glucose, Bld 157 (*)    BUN 31 (*)    Total Protein 6.0 (*)    Albumin 2.9 (*)    AST 55 (*)    ALT 61 (*)    All other components within normal limits  URINALYSIS, ROUTINE W REFLEX MICROSCOPIC (NOT AT [facility]) - Abnormal; Notable for the following:    APPearance CLOUDY (*)    All other components within normal limits  I-STAT CHEM 8, ED - Abnormal; Notable for the following:    Chloride 98 (*)    BUN 28 (*)    Glucose, Bld 154 (*)    Hemoglobin 10.9 (*)    HCT 32.0 (*)    All other components  within normal limits  I-STAT CG4 LACTIC ACID, ED - Abnormal; Notable for the following:    Lactic Acid, Venous 2.50 (*)    All other components within normal limits  URINE CULTURE  CULTURE, BLOOD (ROUTINE X 2)  CULTURE, BLOOD (ROUTINE X 2)  LIPASE, BLOOD    EKG  EKG Interpretation None       Radiology Dg Chest 2 View  Result Date: 09/28/2016 CLINICAL DATA:  Progressive weakness over the past few  days. EXAM: CHEST  2 VIEW COMPARISON:  07/13/2016 FINDINGS: Right jugular port extends to the low SVC. No airspace consolidation. No effusion. Unremarkable mediastinal and cardiac contours. Unchanged T12 compression deformity. IMPRESSION: No active cardiopulmonary disease. Electronically Signed   By: Andreas Newport M.D.   On: 09/28/2016 06:23    Procedures Procedures (including critical care time)  Medications Ordered in ED Medications  cefTRIAXone (ROCEPHIN) 1 g in dextrose 5 % 50 mL IVPB (not administered)  sodium chloride 0.9 % bolus 1,000 mL (0 mLs Intravenous Stopped 09/28/16 0735)    And  sodium chloride 0.9 % bolus 500 mL (500 mLs Intravenous New Bag/Given 09/28/16 0734)  cefTRIAXone (ROCEPHIN) 2 g in dextrose 5 % 50 mL IVPB (0 g Intravenous Stopped 09/28/16 0723)  acetaminophen (TYLENOL) tablet 650 mg (650 mg Oral Given 09/28/16 0641)     Initial Impression / Assessment and Plan / ED Course  I have reviewed the triage vital signs and the nursing notes.  Pertinent labs & imaging results that were available during my care of the patient were reviewed by me and considered in my medical decision making (see chart for details).  Clinical Course    BP 122/57   Pulse 96   Temp 98.8 F (37.1 C) (Oral)   Resp 20   Ht 5\' 5"  (1.651 m)   Wt 48.5 kg   SpO2 93%   BMI 17.81 kg/m    Final Clinical Impressions(s) / ED Diagnoses   Final diagnoses:  Weakness  Sepsis, due to unspecified organism [facility])    New Prescriptions New Prescriptions   No medications on file   6:35  AM Patient here for generalized weakness. She has a documented temperature of 101.1, a recent UA that shows urine tract infection. She is tachycardic, mildly tachypneic. We'll initiate sepsis protocol, patient started on Rocephin. IV fluid at 30 mL per kilogram given.  8:07 AM I have reviewed pt's recent urinalysis without recent UTI.  Urine culture positive for enterococcus in August.  Patient has elevated WBC of 29. Lactic acid is 2.5. Urine is unremarkable. Urine culture sent. Chest x-ray without focal infiltrate.  Appreciate consultation from [facility] Dr. Hartford Poli who agrees to see pt in the ER and will admit to med surg for further care.  Care discussed with DR. Palumbo  CRITICAL CARE Performed by: Domenic Moras Total critical care time: 35 minutes Critical care time was exclusive of separately billable procedures and treating other patients. Critical care was necessary to treat or prevent imminent or life-threatening deterioration. Critical care was time spent personally by me on the following activities: development of treatment plan with patient and/or surrogate as well as nursing, discussions with consultants, evaluation of patient's response to treatment, examination of patient, obtaining history from patient or surrogate, ordering and performing treatments and interventions, ordering and review of laboratory studies, ordering and review of radiographic studies, pulse oximetry and re-evaluation of patient's condition.    Domenic Moras, PA-C 09/28/16 [id number]    April Palumbo, MD 09/28/16 [phone]

## 2016-09-28 NOTE — Progress Notes (Signed)
Pharmacy Antibiotic Note  Crystal Small is a 81 y.o. female admitted on 09/28/2016 with sepsis.  Pt has a history of enterococcus UTI and on concurrent chemotherapy.  Pharmacy has been consulted for vancomycin dosing.  Plan: Vancomycin 1gm IV q24h Follow renal function, cultures, clinical course  Height: 5\' 5"  (165.1 cm) Weight: 107 lb (48.5 kg) IBW/kg (Calculated) : 57  Temp (24hrs), Avg:99.4 F (37.4 C), Min:98.2 F (36.8 C), Max:101.1 F (38.4 C)   Recent Labs Lab 09/26/16 0906 09/26/16 0907 09/28/16 0617 09/28/16 0632 09/28/16 0635  WBC 23.4*  --  29.0*  --   --   CREATININE  --  0.6 0.61 0.60  --   LATICACIDVEN  --   --   --   --  2.50*    Estimated Creatinine Clearance: 43.7 mL/min (by C-G formula based on SCr of 0.6 mg/dL).    No Known Allergies  Antimicrobials this admission: 11/4 ceftriaxone >> 11/4 vancomycin >> Dose adjustments this admission:   Microbiology results: 11/4 BCx: sent 11/4 UCx: sent   Thank you for allowing pharmacy to be a part of this patient's care.  Dolly Rias RPh 09/28/2016, 9:51 AM Pager [phone]

## 2016-09-28 NOTE — ED Notes (Signed)
Report given to Donna RN

## 2016-09-28 NOTE — Progress Notes (Signed)
Patient arrived this am to floor,from ED. Patient is alert and oriented x 3. Forgetful at time. No c/o pain. Vital signs obtained by NT and recorded. Oriented to room and environment. IVF started as ordered. Vancomycin infusing well. Has IV chemo going through portacath, infusing,chemo nurse Jenny Reichmann checked the med.  Daughter at bedside.Will continue to monitor

## 2016-09-29 DIAGNOSIS — A419 Sepsis, unspecified organism: Principal | ICD-10-CM

## 2016-09-29 DIAGNOSIS — R531 Weakness: Secondary | ICD-10-CM

## 2016-09-29 DIAGNOSIS — C251 Malignant neoplasm of body of pancreas: Secondary | ICD-10-CM

## 2016-09-29 DIAGNOSIS — Z95828 Presence of other vascular implants and grafts: Secondary | ICD-10-CM

## 2016-09-29 DIAGNOSIS — B370 Candidal stomatitis: Secondary | ICD-10-CM

## 2016-09-29 DIAGNOSIS — N3000 Acute cystitis without hematuria: Secondary | ICD-10-CM

## 2016-09-29 LAB — CBC
HEMATOCRIT: 30.9 % — AB (ref 36.0–46.0)
HEMOGLOBIN: 10.2 g/dL — AB (ref 12.0–15.0)
MCH: 31.7 pg (ref 26.0–34.0)
MCHC: 33 g/dL (ref 30.0–36.0)
MCV: 96 fL (ref 78.0–100.0)
Platelets: 199 10*3/uL (ref 150–400)
RBC: 3.22 MIL/uL — AB (ref 3.87–5.11)
RDW: 16.8 % — AB (ref 11.5–15.5)
WBC: 25.2 10*3/uL — AB (ref 4.0–10.5)

## 2016-09-29 LAB — URINE CULTURE: CULTURE: NO GROWTH

## 2016-09-29 LAB — BASIC METABOLIC PANEL
ANION GAP: 7 (ref 5–15)
BUN: 18 mg/dL (ref 6–20)
CHLORIDE: 106 mmol/L (ref 101–111)
CO2: 25 mmol/L (ref 22–32)
Calcium: 8.1 mg/dL — ABNORMAL LOW (ref 8.9–10.3)
Creatinine, Ser: 0.48 mg/dL (ref 0.44–1.00)
Glucose, Bld: 127 mg/dL — ABNORMAL HIGH (ref 65–99)
POTASSIUM: 3 mmol/L — AB (ref 3.5–5.1)
SODIUM: 138 mmol/L (ref 135–145)

## 2016-09-29 LAB — MAGNESIUM: Magnesium: 2 mg/dL (ref 1.7–2.4)

## 2016-09-29 LAB — HEMOGLOBIN A1C
Hgb A1c MFr Bld: 5.4 % (ref 4.8–5.6)
MEAN PLASMA GLUCOSE: 108 mg/dL

## 2016-09-29 MED ORDER — POTASSIUM CHLORIDE CRYS ER 20 MEQ PO TBCR
40.0000 meq | EXTENDED_RELEASE_TABLET | ORAL | Status: AC
  Administered 2016-09-29 (×2): 40 meq via ORAL
  Filled 2016-09-29 (×2): qty 2

## 2016-09-29 MED ORDER — NYSTATIN 100000 UNIT/ML MT SUSP
5.0000 mL | Freq: Four times a day (QID) | OROMUCOSAL | Status: DC
  Administered 2016-09-29 – 2016-09-30 (×6): 500000 [IU] via ORAL
  Filled 2016-09-29 (×6): qty 5

## 2016-09-29 NOTE — Progress Notes (Signed)
PROGRESS NOTE    Crystal Small  [id number] DOB: December 20, 1934 DOA: 09/28/2016  PCP: Haywood Pao, MD   Brief Narrative:  Crystal Small is a 81 y.o. female with medical history significant of metastatic pancreatic cancer follows with Dr. Benay Spice as outpatient. She reported that she is having generalized weakness for the past 2 days, she was evaluated on 11/2 by her oncologist to start FOLFOX, reported same symptoms back then and her urine was consistent with UTI, started on ciprofloxacin. She did not feel better and generalized weakness got worse so she came to the hospital for further evaluation, she was found to have fever of 101.1. WBC 29 and lactic acid of 2.5   Subjective: Feels like weakness is slightly better. No fever to chills. No flank pain.   Assessment & Plan:   Principal Problem:   Sepsis, UTI - fever, tachycardia, leukocytosis - failed outpt Cipro - Vanc and Rocephin- last culture in 8/17 grew Enterococcus  Active Problems:   Cancer of pancreas, body with liver mets - outpt management per Dr Ammie Dalton- first chemo with Folfox on 11/2 -Poorly differentiated adenocarcinoma of the pancreas diagnosed in August 2017. -  5 cycles of gemcitabine/Abraxane, completed on 09/05/2016.    Port catheter in place  Thrush - start Nystatin  DVT prophylaxis: Heparin Code Status: Full code Family Communication: daughter Disposition Plan: home tomorrow if stable Consultants:    Procedures:    Antimicrobials:  Anti-infectives    Start     Dose/Rate Route Frequency Ordered Stop   09/29/16 0800  cefTRIAXone (ROCEPHIN) 1 g in dextrose 5 % 50 mL IVPB     1 g 100 mL/hr over 30 Minutes Intravenous Every 24 hours 09/28/16 0642     09/28/16 1000  vancomycin (VANCOCIN) IVPB 1000 mg/200 mL premix     1,000 mg 200 mL/hr over 60 Minutes Intravenous Every 24 hours 09/28/16 0947     09/28/16 0630  cefTRIAXone (ROCEPHIN) 2 g in dextrose 5 % 50 mL IVPB     2 g 100 mL/hr over 30  Minutes Intravenous  Once 09/28/16 0628 09/28/16 0723       Objective: Vitals:   09/28/16 0939 09/28/16 1343 09/28/16 2220 09/29/16 0550  BP: (!) 102/51 118/68 132/65 140/68  Pulse: 83 81 (!) 104 80  Resp: 16 16 20 18   Temp: 98.2 F (36.8 C) 98.2 F (36.8 C) (!) 100.9 F (38.3 C) 97.8 F (36.6 C)  TempSrc: Oral Oral Oral Oral  SpO2: 98% 96% 95% 99%  Weight: 48.5 kg (107 lb)     Height:        Intake/Output Summary (Last 24 hours) at 09/29/16 1307 Last data filed at 09/29/16 0933  Gross per 24 hour  Intake             3110 ml  Output             1400 ml  Net             1710 ml   Filed Weights   09/28/16 0525 09/28/16 0939  Weight: 48.5 kg (107 lb) 48.5 kg (107 lb)    Examination: General exam: Appears comfortable  HEENT: PERRLA, oral mucosa moist, no sclera icterus - + thrush Respiratory system: Clear to auscultation. Respiratory effort normal. Cardiovascular system: S1 & S2 heard, RRR.  No murmurs  Gastrointestinal system: Abdomen soft, non-tender, nondistended. Normal bowel sound. No organomegaly Central nervous system: Alert and oriented. No focal neurological deficits. Extremities: No cyanosis, clubbing  or edema Skin: No rashes or ulcers Psychiatry:  Mood & affect appropriate.     Data Reviewed: I have personally reviewed following labs and imaging studies  CBC:  Recent Labs Lab 09/26/16 0906 09/28/16 0617 09/28/16 0632 09/29/16 0520  WBC 23.4* 29.0*  --  25.2*  NEUTROABS 19.4* 26.9*  --   --   HGB 9.6* 9.6* 10.9* 10.2*  HCT 29.4* 28.4* 32.0* 30.9*  MCV 97.1 94.7  --  96.0  PLT 242 257  --  [id number]   Basic Metabolic Panel:  Recent Labs Lab 09/26/16 0907 09/28/16 0617 09/28/16 0632 09/29/16 0520  NA 134* 135 137 138  K 3.2* 3.6 3.6 3.0*  CL  --  101 98* 106  CO2 28 27  --  25  GLUCOSE 153* 157* 154* 127*  BUN 17.9 31* 28* 18  CREATININE 0.6 0.61 0.60 0.48  CALCIUM 10.7* 9.4  --  8.1*  MG  --   --   --  2.0   GFR: Estimated Creatinine  Clearance: 43.7 mL/min (by C-G formula based on SCr of 0.48 mg/dL). Liver Function Tests:  Recent Labs Lab 09/26/16 0907 09/28/16 0617  AST 42* 55*  ALT 47 61*  ALKPHOS 124 101  BILITOT 0.73 0.9  PROT 6.1* 6.0*  ALBUMIN 2.6* 2.9*    Recent Labs Lab 09/28/16 0617  LIPASE 51   No results for input(s): AMMONIA in the last 168 hours. Coagulation Profile:  Recent Labs Lab 09/28/16 1048  INR 1.20   Cardiac Enzymes: No results for input(s): CKTOTAL, CKMB, CKMBINDEX, TROPONINI in the last 168 hours. BNP (last 3 results) No results for input(s): PROBNP in the last 8760 hours. HbA1C:  Recent Labs  09/28/16 1048  HGBA1C 5.4   CBG: No results for input(s): GLUCAP in the last 168 hours. Lipid Profile: No results for input(s): CHOL, HDL, LDLCALC, TRIG, CHOLHDL, LDLDIRECT in the last 72 hours. Thyroid Function Tests:  Recent Labs  09/28/16 1048  TSH 1.715   Anemia Panel: No results for input(s): VITAMINB12, FOLATE, FERRITIN, TIBC, IRON, RETICCTPCT in the last 72 hours. Urine analysis:    Component Value Date/Time   COLORURINE YELLOW 09/28/2016 0635   APPEARANCEUR CLOUDY (A) 09/28/2016 0635   LABSPEC 1.017 09/28/2016 0635   LABSPEC 1.010 09/26/2016 1132   PHURINE 7.0 09/28/2016 0635   GLUCOSEU NEGATIVE 09/28/2016 0635   GLUCOSEU Negative 09/26/2016 1132   HGBUR NEGATIVE 09/28/2016 0635   BILIRUBINUR NEGATIVE 09/28/2016 0635   BILIRUBINUR Negative 09/26/2016 1132   KETONESUR NEGATIVE 09/28/2016 0635   PROTEINUR NEGATIVE 09/28/2016 0635   UROBILINOGEN 0.2 09/26/2016 1132   NITRITE NEGATIVE 09/28/2016 0635   LEUKOCYTESUR NEGATIVE 09/28/2016 0635   LEUKOCYTESUR Moderate 09/26/2016 1132   Sepsis Labs: @LABRCNTIP (procalcitonin:4,lacticidven:4) ) Recent Results (from the past 240 hour(s))  Urine Culture     Status: None   Collection Time: 09/26/16  1:00 PM  Result Value Ref Range Status   Urine Culture, Routine Final report  Final   Urine Culture result 1  Comment  Final    Comment: Culture shows less than 10,000 colony forming units of bacteria per milliliter of urine. This colony count is not generally considered to be clinically significant.   Blood culture (routine x 2)     Status: None (Preliminary result)   Collection Time: 09/28/16  6:15 AM  Result Value Ref Range Status   Specimen Description BLOOD LEFT ARM  Final   Special Requests BOTTLES DRAWN AEROBIC AND ANAEROBIC 5CC  Final  Culture   Final    NO GROWTH < 24 HOURS Performed at [facility]    Report Status PENDING  Incomplete  Blood culture (routine x 2)     Status: None (Preliminary result)   Collection Time: 09/28/16  6:17 AM  Result Value Ref Range Status   Specimen Description BLOOD RIGHT ARM  Final   Special Requests BOTTLES DRAWN AEROBIC AND ANAEROBIC 5CC  Final   Culture   Final    NO GROWTH < 24 HOURS Performed at [facility]    Report Status PENDING  Incomplete  Urine culture     Status: None   Collection Time: 09/28/16  6:35 AM  Result Value Ref Range Status   Specimen Description URINE, CLEAN CATCH  Final   Special Requests NONE  Final   Culture NO GROWTH Performed at [facility]   Final   Report Status 09/29/2016 FINAL  Final         Radiology Studies: Dg Chest 2 View  Result Date: 09/28/2016 CLINICAL DATA:  Progressive weakness over the past few days. EXAM: CHEST  2 VIEW COMPARISON:  07/13/2016 FINDINGS: Right jugular port extends to the low SVC. No airspace consolidation. No effusion. Unremarkable mediastinal and cardiac contours. Unchanged T12 compression deformity. IMPRESSION: No active cardiopulmonary disease. Electronically Signed   By: Andreas Newport M.D.   On: 09/28/2016 06:23      Scheduled Meds: . cefTRIAXone (ROCEPHIN)  IV  1 g Intravenous Q24H  . heparin  5,000 Units Subcutaneous Q8H  . nystatin  5 mL Oral QID  . sodium chloride flush  3 mL Intravenous Q12H  . vancomycin  1,000 mg Intravenous Q24H    Continuous Infusions: . sodium chloride 100 mL/hr at 09/29/16 1055     LOS: 1 day    Time spent in minutes: 70    [place], MD [facility] Pager: [email or web address] Password [email or web address] 09/29/2016, 1:07 PM

## 2016-09-30 DIAGNOSIS — D72829 Elevated white blood cell count, unspecified: Secondary | ICD-10-CM

## 2016-09-30 DIAGNOSIS — R509 Fever, unspecified: Secondary | ICD-10-CM

## 2016-09-30 DIAGNOSIS — E43 Unspecified severe protein-calorie malnutrition: Secondary | ICD-10-CM | POA: Insufficient documentation

## 2016-09-30 LAB — BASIC METABOLIC PANEL
Anion gap: 6 (ref 5–15)
Anion gap: 3 — ABNORMAL LOW (ref 5–15)
BUN: 10 mg/dL (ref 6–20)
BUN: 10 mg/dL (ref 6–20)
CHLORIDE: 104 mmol/L (ref 101–111)
CHLORIDE: 108 mmol/L (ref 101–111)
CO2: 24 mmol/L (ref 22–32)
CO2: 23 mmol/L (ref 22–32)
Calcium: 7.2 mg/dL — ABNORMAL LOW (ref 8.9–10.3)
Calcium: 7.3 mg/dL — ABNORMAL LOW (ref 8.9–10.3)
Creatinine, Ser: 0.43 mg/dL — ABNORMAL LOW (ref 0.44–1.00)
Creatinine, Ser: 0.38 mg/dL — ABNORMAL LOW (ref 0.44–1.00)
GFR calc Af Amer: >60 mL/min (ref >60)
GFR calc Af Amer: >60 mL/min (ref >60)
GFR calc non Af Amer: >60 mL/min (ref >60)
GFR calc non Af Amer: >60 mL/min (ref >60)
GLUCOSE: 101 mg/dL — AB (ref 65–99)
Glucose, Bld: 142 mg/dL — ABNORMAL HIGH (ref 65–99)
POTASSIUM: 2.7 mmol/L — AB (ref 3.5–5.1)
POTASSIUM: 4.2 mmol/L (ref 3.5–5.1)
SODIUM: 134 mmol/L — AB (ref 135–145)
Sodium: 134 mmol/L — ABNORMAL LOW (ref 135–145)

## 2016-09-30 LAB — CBC
HEMATOCRIT: 25.9 % — AB (ref 36.0–46.0)
Hemoglobin: 8.7 g/dL — ABNORMAL LOW (ref 12.0–15.0)
MCH: 31.3 pg (ref 26.0–34.0)
MCHC: 33.6 g/dL (ref 30.0–36.0)
MCV: 93.2 fL (ref 78.0–100.0)
Platelets: 134 10*3/uL — ABNORMAL LOW (ref 150–400)
RBC: 2.78 MIL/uL — ABNORMAL LOW (ref 3.87–5.11)
RDW: 16 % — AB (ref 11.5–15.5)
WBC: 16.6 10*3/uL — AB (ref 4.0–10.5)

## 2016-09-30 MED ORDER — NYSTATIN 100000 UNIT/ML MT SUSP: 5.0000 mL | Freq: Four times a day (QID) | OROMUCOSAL | 0 refills | Status: AC

## 2016-09-30 MED ORDER — BOOST PLUS PO LIQD
237.0000 mL | Freq: Three times a day (TID) | ORAL | Status: DC
  Administered 2016-09-30: 237 mL via ORAL
  Filled 2016-09-30 (×2): qty 237

## 2016-09-30 MED ORDER — HEPARIN SOD (PORK) LOCK FLUSH 100 UNIT/ML IV SOLN
500.0000 [IU] | Freq: Once | INTRAVENOUS | Status: DC
  Filled 2016-09-30: qty 5

## 2016-09-30 MED ORDER — SODIUM CHLORIDE 0.9 % IV SOLN
Freq: Once | INTRAVENOUS | Status: AC
  Administered 2016-09-30: 10:00:00 via INTRAVENOUS
  Filled 2016-09-30: qty 1000

## 2016-09-30 MED ORDER — AMOXICILLIN 500 MG PO CAPS: 500.0000 mg | ORAL_CAPSULE | Freq: Three times a day (TID) | ORAL | 0 refills | Status: DC

## 2016-09-30 MED ORDER — POTASSIUM CHLORIDE CRYS ER 20 MEQ PO TBCR
40.0000 meq | EXTENDED_RELEASE_TABLET | ORAL | Status: AC
  Administered 2016-09-30 (×2): 40 meq via ORAL
  Filled 2016-09-30 (×2): qty 2

## 2016-09-30 NOTE — Discharge Instructions (Signed)
Continue Nystatin for at least 1 wk for thrush.   Please take all your medications with you for your next visit with your Primary MD. Please request your Primary MD to go over all hospital test results at the follow up. Please ask your Primary MD to get all Hospital records sent to his/her office.  If you experience worsening of your admission symptoms, develop shortness of breath, chest pain, suicidal or homicidal thoughts or a life threatening emergency, you must seek medical attention immediately by calling 911 or calling your MD.  Dennis Bast must read the complete instructions/literature along with all the possible adverse reactions/side effects for all the medicines you take including new medications that have been prescribed to you. Take new medicines after you have completely understood and accpet all the possible adverse reactions/side effects.   Do not drive when taking pain medications or sedatives.    Do not take more than prescribed Pain, Sleep and Anxiety Medications  If you have smoked or chewed Tobacco in the last 2 yrs please stop. Stop any regular alcohol and or recreational drug use.  Wear Seat belts while driving.

## 2016-09-30 NOTE — Progress Notes (Signed)
Initial Nutrition Assessment  DOCUMENTATION CODES:   Severe malnutrition in context of chronic illness, Underweight  INTERVENTION:  Provide Boost Plus po TID, each supplement provides 360 kcal and 14 grams protein.  Reviewed When Things Aren't Tasting Right handout with patient and family member. Reviewed options to improve intake during taste changes and answered questions.   Encouraged intake of adequate calories and protein through continued small, frequent meals. Encouraged options with more protein and discussed options patient may enjoy.  NUTRITION DIAGNOSIS:   Increased nutrient needs related to catabolic illness, cancer and cancer related treatments as evidenced by estimated needs.  GOAL:   Patient will meet greater than or equal to 90% of their needs  MONITOR:   PO intake, Supplement acceptance, Labs, Weight trends, I & O's  REASON FOR ASSESSMENT:   Malnutrition Screening Tool    ASSESSMENT:   81 y.o.femalewith medical history significant of metastatic pancreatic cancer follows with Dr. Benay Spice as outpatient. She reported that she is having generalized weakness for the past 2 days, she was evaluated on 11/2 by her oncologist to start FOLFOX, reported same symptoms back then and her urine was consistent with UTI, started on ciprofloxacin. Found to have sepsis, UTI, thrush.   -Of note, patient is followed by outpatient dietitian. She has worked with patient on consuming small, frequent meals and snacks utilizing high-calorie, high-protein foods, using oral nutrition supplements, and strategies for taste alterations. Patient cancelled her appointment with RD on 10/26 and did not reschedule.  Spoke with patient and family member at bedside. Patient reports her appetite is poor and she is experiencing early satiety. Also endorses taste changes that affect intake and thrush. Denies N/V, abdominal pain, or difficulty chewing/swallowing. She attempts to have small, frequent  meals, but is only able to have a few bites at a time. Family member reports they are working on reminding patient that she needs more than the few bites. She eats snacks of nuts, Special K protein bites, 1-2 slices of apple, and grapes. She has chicken occasionally. She has been attempting to drink 3 ONS drinks daily (Boost High Calorie Vanilla and Premier Protein Chocolate), but most days can only drink 2.  UBW 128 lbs. Patient has lost 21 lbs (16% body weight) over the past year, which is not significant for time frame.  Medications reviewed and include: Mycostatin 5 ml QID.  Labs reviewed: Sodium 134, Potassium 2.7, Creatinine 0.43, Glucose 101, HgbA1c 5.4.  Nutrition-Focused physical exam completed. Findings are severe fat depletion, severe muscle depletion, and no edema. Muscle wasting on legs is less severe than on upper body.  Patient meets criteria for severe chronic malnutrition due to nutrition-focused physical findings of severe fat depletion and severe muscle depletion.  Discussed with RN.   Diet Order:  Diet regular Room service appropriate? Yes; Fluid consistency: Thin  Skin:  Reviewed, no issues  Last BM:  09/27/2016  Height:   Ht Readings from Last 1 Encounters:  09/28/16 5\' 5"  (1.651 m)    Weight:   Wt Readings from Last 1 Encounters:  09/28/16 107 lb (48.5 kg)    Ideal Body Weight:  56.82 kg  BMI:  Body mass index is 17.81 kg/m.  Estimated Nutritional Needs:   Kcal:  1455-1700 (30-35 kcal/kg)  Protein:  73-87 grams (1.5-1.8 grams/kg)  Fluid:  >/= 1.2 L/day (25 ml/kg)  EDUCATION NEEDS:   Education needs addressed  Willey Blade, MS, RD, LDN Pager: [phone] After Hours Pager: [phone]

## 2016-09-30 NOTE — Discharge Summary (Signed)
Physician Discharge Summary  Crystal Small [id number] DOB: 1935/09/07 DOA: 09/28/2016  PCP: Haywood Pao, MD  Admit date: 09/28/2016 Discharge date: 09/30/2016  Admitted From: home  Disposition:  home   Recommendations for Outpatient Follow-up:  1. If symptoms recur on Amoxicillin, will need to transition to Zyvox   Discharge Condition:  stable   CODE STATUS:  Full code   Diet recommendation:  Regular diet Consultations:  Phone consult with ID    Discharge Diagnoses:  Principal Problem:   Sepsis ([place]) Active Problems:   UTI (urinary tract infection)   Fever   Leukocytosis   Cancer of pancreas, body ([place])   Port catheter in place   Weakness generalized    Subjective: No complaints today.   Brief Summary: Crystal Small a 81 y.o.femalewith medical history significant of metastatic pancreatic cancer follows with Dr. Benay Spice as outpatient. She reported that she is having generalized weakness for the past 2 days, she was evaluated on 11/2 by her oncologist to start FOLFOX, reported same symptoms back then and her urine was consistent with UTI, started on ciprofloxacin. She did not feel better and generalized weakness got worse so she came to the hospital for further evaluation, she was found to have fever of 101.1. WBC 29and lactic acid of 2.5   Hospital Course:  Principal Problem:   Sepsis, UTI - fever, tachycardia, leukocytosis - failed outpt Cipro - has been on Vanc and Rocephin- last culture in 8/17 grew Enterococcus faecalis which was sensitive to Amoxicillin - fever resolved- Leukocytosis improving- see labs below - cultures including urine culture negative - will transition to Amoxicillin for 5 more days - if this is ineffective, will need to switch to Zyvox- discussed with ID  Active Problems:   Cancer of pancreas, body with liver mets - outpt management per Dr Ammie Dalton- first chemo with Folfox on 11/2 -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 -  5 cycles of gemcitabine/Abraxane, completed on 09/05/2016.    Port catheter in place  Thrush - start Nystatin   Discharge Instructions  Discharge Instructions    Call MD for:  difficulty breathing, headache or visual disturbances    Complete by:  As directed    Call MD for:  extreme fatigue    Complete by:  As directed    Call MD for:  hives    Complete by:  As directed    Call MD for:  persistant dizziness or light-headedness    Complete by:  As directed    Call MD for:  persistant nausea and vomiting    Complete by:  As directed    Call MD for:  redness, tenderness, or signs of infection (pain, swelling, redness, odor or green/yellow discharge around incision site)    Complete by:  As directed    Call MD for:  temperature >100.4    Complete by:  As directed        Medication List    STOP taking these medications   ciprofloxacin 250 MG tablet Commonly known as:  CIPRO     TAKE these medications   acetaminophen 325 MG tablet Commonly known as:  TYLENOL Take 325 mg by mouth every 6 (six) hours as needed for fever.   ALPRAZolam 0.25 MG tablet Commonly known as:  XANAX Take 1 tablet (0.25 mg total) by mouth at bedtime as needed for sleep.   amoxicillin 500 MG capsule Commonly known as:  AMOXIL Take 1 capsule (500 mg total) by  mouth 3 (three) times daily.   HYDROcodone-acetaminophen 5-325 MG tablet Commonly known as:  NORCO/VICODIN Take 1 tablet by mouth every 4 (four) hours as needed for moderate pain.   lidocaine-prilocaine cream Commonly known as:  EMLA Apply 1 application topically as needed (prior to accessing port).   nystatin 100000 UNIT/ML suspension Commonly known as:  MYCOSTATIN Take 5 mLs (500,000 Units total) by mouth 4 (four) times daily.   omeprazole 40 MG capsule Commonly known as:  PRILOSEC Take 40 mg by mouth daily.   ondansetron 8 MG tablet Commonly known as:  ZOFRAN Take 1 tablet (8 mg total) by  mouth every 8 (eight) hours as needed for nausea or vomiting.   potassium chloride SA 20 MEQ tablet Commonly known as:  K-DUR,KLOR-CON Take 1 tablet (20 mEq total) by mouth 2 (two) times daily.   prochlorperazine 5 MG tablet Commonly known as:  COMPAZINE Take 1 tablet (5 mg total) by mouth every 6 (six) hours as needed for nausea or vomiting.       No Known Allergies   Procedures/Studies:  Dg Chest 2 View  Result Date: 09/28/2016 CLINICAL DATA:  Progressive weakness over the past few days. EXAM: CHEST  2 VIEW COMPARISON:  07/13/2016 FINDINGS: Right jugular port extends to the low SVC. No airspace consolidation. No effusion. Unremarkable mediastinal and cardiac contours. Unchanged T12 compression deformity. IMPRESSION: No active cardiopulmonary disease. Electronically Signed   By: Andreas Newport M.D.   On: 09/28/2016 06:23   Ct Abdomen Pelvis W Contrast  Result Date: 09/12/2016 CLINICAL DATA:  Followup pancreas cancer EXAM: CT ABDOMEN AND PELVIS WITH CONTRAST TECHNIQUE: Multidetector CT imaging of the abdomen and pelvis was performed using the standard protocol following bolus administration of intravenous contrast. CONTRAST:  134mL ISOVUE-300 IOPAMIDOL (ISOVUE-300) INJECTION 61% COMPARISON:  06/14/2016 FINDINGS: Lower chest: No pleural effusion.  Lung bases are clear. Hepatobiliary: Gallbladder is normal. No biliary dilatation. Multifocal liver metastases are identified. Index lesion within the posterior medial right lobe measures 4.4 x 3.6 cm, image 12 of series 2. Previously 3.8 x 3.0 cm. Anterior right lobe of liver lesion measures 3.1 x 1.9 cm, image 13 of series 2. Previously 2.1 x 2.0 cm. New lesion within the lateral segment of left lobe measures 2.2 x 2.0 cm, image 18 of series 2. Enlarging liver lesion within the lateral segment of left lobe measures 3.3 x 2.6 cm, image 32 of series 2. Previously 1.1 x 0.9 cm. Gallbladder is normal. No biliary dilatation. Pancreas: Mass involving  the tail of pancreas measures 3.3 x 3.4 cm, image 25 of series 2. Previously 4.5 x 5.7 cm. 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 Spleen: There are several areas of low-attenuation which may reflect perfusion anomaly secondary to occlusion of the splenic artery and splenic vein. This was not seen on the previous exam however. Splenic infarcts as well as splenic metastasis could conceivably have a similar appearance. Adrenals/Urinary Tract: The adrenal glands are normal. Normal appearance of the kidneys. Urinary bladder is unremarkable. Stomach/Bowel: Small hiatal hernia. The small bowel loops have a normal course and caliber. Moderate stool burden identified within the colon. Vascular/Lymphatic: Aortic atherosclerosis. Gastrohepatic ligament lymph node measures 9 mm, image 11 of series 4. Unchanged from previous exam. Reproductive: Status post hysterectomy. No adnexal masses. Other: No ascites or focal fluid collections. No peritoneal metastases identified. Musculoskeletal: Degenerative disc disease noted within the lumbar spine. There is a curvature the lumbar spine which is convex towards the right.  IMPRESSION: 1. Mixed interval response to therapy. 2. Significant progression of liver metastases. 3. Primary lesion involving the tail of pancreas is decreased in size from previous exam. Electronically Signed   By: Kerby Moors M.D.   On: 09/12/2016 12:21       Discharge Exam: Vitals:   09/29/16 2133 09/30/16 0610  BP: (!) 143/72 (!) 125/58  Pulse: 91   Resp: 16   Temp: 100.3 F (37.9 C) 98.6 F (37 C)   Vitals:   09/29/16 0550 09/29/16 1318 09/29/16 2133 09/30/16 0610  BP: 140/68 (!) 142/75 (!) 143/72 (!) 125/58  Pulse: 80 96 91   Resp: 18 16 16    Temp: 97.8 F (36.6 C) 98.6 F (37 C) 100.3 F (37.9 C) 98.6 F (37 C)  TempSrc: Oral Oral Oral Oral  SpO2: 99% 100% 96%   Weight:      Height:        General: Pt is alert, awake, not in acute  distress Cardiovascular: RRR, S1/S2 +, no rubs, no gallops Respiratory: CTA bilaterally, no wheezing, no rhonchi Abdominal: Soft, NT, ND, bowel sounds + Extremities: no edema, no cyanosis    The results of significant diagnostics from this hospitalization (including imaging, microbiology, ancillary and laboratory) are listed below for reference.     Microbiology: Recent Results (from the past 240 hour(s))  Urine Culture     Status: None   Collection Time: 09/26/16  1:00 PM  Result Value Ref Range Status   Urine Culture, Routine Final report  Final   Urine Culture result 1 Comment  Final    Comment: Culture shows less than 10,000 colony forming units of bacteria per milliliter of urine. This colony count is not generally considered to be clinically significant.   Blood culture (routine x 2)     Status: None (Preliminary result)   Collection Time: 09/28/16  6:15 AM  Result Value Ref Range Status   Specimen Description BLOOD LEFT ARM  Final   Special Requests BOTTLES DRAWN AEROBIC AND ANAEROBIC 5CC  Final   Culture   Final    NO GROWTH < 24 HOURS Performed at [facility]    Report Status PENDING  Incomplete  Blood culture (routine x 2)     Status: None (Preliminary result)   Collection Time: 09/28/16  6:17 AM  Result Value Ref Range Status   Specimen Description BLOOD RIGHT ARM  Final   Special Requests BOTTLES DRAWN AEROBIC AND ANAEROBIC 5CC  Final   Culture   Final    NO GROWTH < 24 HOURS Performed at [facility]    Report Status PENDING  Incomplete  Urine culture     Status: None   Collection Time: 09/28/16  6:35 AM  Result Value Ref Range Status   Specimen Description URINE, CLEAN CATCH  Final   Special Requests NONE  Final   Culture NO GROWTH Performed at [facility]   Final   Report Status 09/29/2016 FINAL  Final     Labs: BNP (last 3 results) No results for input(s): BNP in the last 8760 hours. Basic Metabolic Panel:  Recent  Labs Lab 09/26/16 0907 09/28/16 0617 09/28/16 0632 09/29/16 0520 09/30/16 0655  NA 134* 135 137 138 134*  K 3.2* 3.6 3.6 3.0* 2.7*  CL  --  101 98* 106 104  CO2 28 27  --  25 24  GLUCOSE 153* 157* 154* 127* 101*  BUN 17.9 31* 28* 18 10  CREATININE 0.6  0.61 0.60 0.48 0.43*  CALCIUM 10.7* 9.4  --  8.1* 7.2*  MG  --   --   --  2.0  --    Liver Function Tests:  Recent Labs Lab 09/26/16 0907 09/28/16 0617  AST 42* 55*  ALT 47 61*  ALKPHOS 124 101  BILITOT 0.73 0.9  PROT 6.1* 6.0*  ALBUMIN 2.6* 2.9*    Recent Labs Lab 09/28/16 0617  LIPASE 51   No results for input(s): AMMONIA in the last 168 hours. CBC:  Recent Labs Lab 09/26/16 0906 09/28/16 0617 09/28/16 0632 09/29/16 0520 09/30/16 0655  WBC 23.4* 29.0*  --  25.2* 16.6*  NEUTROABS 19.4* 26.9*  --   --   --   HGB 9.6* 9.6* 10.9* 10.2* 8.7*  HCT 29.4* 28.4* 32.0* 30.9* 25.9*  MCV 97.1 94.7  --  96.0 93.2  PLT 242 257  --  199 134*   Cardiac Enzymes: No results for input(s): CKTOTAL, CKMB, CKMBINDEX, TROPONINI in the last 168 hours. BNP: Invalid input(s): POCBNP CBG: No results for input(s): GLUCAP in the last 168 hours. D-Dimer No results for input(s): DDIMER in the last 72 hours. Hgb A1c  Recent Labs  09/28/16 1048  HGBA1C 5.4   Lipid Profile No results for input(s): CHOL, HDL, LDLCALC, TRIG, CHOLHDL, LDLDIRECT in the last 72 hours. Thyroid function studies  Recent Labs  09/28/16 1048  TSH 1.715   Anemia work up No results for input(s): VITAMINB12, FOLATE, FERRITIN, TIBC, IRON, RETICCTPCT in the last 72 hours. Urinalysis    Component Value Date/Time   COLORURINE YELLOW 09/28/2016 0635   APPEARANCEUR CLOUDY (A) 09/28/2016 0635   LABSPEC 1.017 09/28/2016 0635   LABSPEC 1.010 09/26/2016 1132   PHURINE 7.0 09/28/2016 0635   GLUCOSEU NEGATIVE 09/28/2016 0635   GLUCOSEU Negative 09/26/2016 1132   HGBUR NEGATIVE 09/28/2016 0635   BILIRUBINUR NEGATIVE 09/28/2016 0635   BILIRUBINUR  Negative 09/26/2016 1132   KETONESUR NEGATIVE 09/28/2016 0635   PROTEINUR NEGATIVE 09/28/2016 0635   UROBILINOGEN 0.2 09/26/2016 1132   NITRITE NEGATIVE 09/28/2016 0635   LEUKOCYTESUR NEGATIVE 09/28/2016 0635   LEUKOCYTESUR Moderate 09/26/2016 1132   Sepsis Labs Invalid input(s): PROCALCITONIN,  WBC,  LACTICIDVEN Microbiology Recent Results (from the past 240 hour(s))  Urine Culture     Status: None   Collection Time: 09/26/16  1:00 PM  Result Value Ref Range Status   Urine Culture, Routine Final report  Final   Urine Culture result 1 Comment  Final    Comment: Culture shows less than 10,000 colony forming units of bacteria per milliliter of urine. This colony count is not generally considered to be clinically significant.   Blood culture (routine x 2)     Status: None (Preliminary result)   Collection Time: 09/28/16  6:15 AM  Result Value Ref Range Status   Specimen Description BLOOD LEFT ARM  Final   Special Requests BOTTLES DRAWN AEROBIC AND ANAEROBIC 5CC  Final   Culture   Final    NO GROWTH < 24 HOURS Performed at [facility]    Report Status PENDING  Incomplete  Blood culture (routine x 2)     Status: None (Preliminary result)   Collection Time: 09/28/16  6:17 AM  Result Value Ref Range Status   Specimen Description BLOOD RIGHT ARM  Final   Special Requests BOTTLES DRAWN AEROBIC AND ANAEROBIC 5CC  Final   Culture   Final    NO GROWTH < 24 HOURS Performed at [facility]    Report Status PENDING  Incomplete  Urine culture     Status: None   Collection Time: 09/28/16  6:35 AM  Result Value Ref Range Status   Specimen Description URINE, CLEAN CATCH  Final   Special Requests NONE  Final   Culture NO GROWTH Performed at [facility]   Final   Report Status 09/29/2016 FINAL  Final     Time coordinating discharge: Over 30 minutes  SIGNED:   Debbe Odea, MD  [facility] 09/30/2016, 1:58 PM Pager   If 7PM-7AM, please contact  night-coverage [email or web address] Password [email or web address]

## 2016-09-30 NOTE — Progress Notes (Signed)
CRITICAL VALUE ALERT  Critical value received:  K+ 2.7  Date of notification:  11/6  Time of notification:  0830  Critical value read back: yes  Nurse who received alert:  Kirkland Hun RN  MD notified (1st page):  Rizwan  Time of first page:  0830  MD notified (2nd page):  Time of second page:  Responding MD:  Wynelle Cleveland  Time MD responded:  [phone]

## 2016-10-02 ENCOUNTER — Telehealth: Payer: Self-pay | Admitting: Oncology

## 2016-10-02 ENCOUNTER — Telehealth: Payer: Self-pay | Admitting: *Deleted

## 2016-10-02 NOTE — Telephone Encounter (Signed)
Per LOS I have scheduled appt and notified the scheruler

## 2016-10-02 NOTE — Telephone Encounter (Signed)
Called patient to confirm appointments for 10/10/16. Left voice message on home phone. 10/02/16

## 2016-10-03 ENCOUNTER — Ambulatory Visit (HOSPITAL_BASED_OUTPATIENT_CLINIC_OR_DEPARTMENT_OTHER): Payer: Medicare Other | Admitting: Oncology

## 2016-10-03 ENCOUNTER — Encounter: Payer: Self-pay | Admitting: Oncology

## 2016-10-03 ENCOUNTER — Other Ambulatory Visit: Payer: Self-pay | Admitting: Medical Oncology

## 2016-10-03 ENCOUNTER — Other Ambulatory Visit: Payer: Self-pay | Admitting: *Deleted

## 2016-10-03 ENCOUNTER — Telehealth: Payer: Self-pay | Admitting: *Deleted

## 2016-10-03 VITALS — BP 100/57 | HR 87 | Temp 97.5°F | Resp 17 | Ht 65.0 in | Wt 110.8 lb

## 2016-10-03 DIAGNOSIS — R530 Neoplastic (malignant) related fatigue: Secondary | ICD-10-CM

## 2016-10-03 DIAGNOSIS — C251 Malignant neoplasm of body of pancreas: Secondary | ICD-10-CM | POA: Diagnosis not present

## 2016-10-03 DIAGNOSIS — R531 Weakness: Secondary | ICD-10-CM | POA: Diagnosis not present

## 2016-10-03 DIAGNOSIS — C787 Secondary malignant neoplasm of liver and intrahepatic bile duct: Secondary | ICD-10-CM

## 2016-10-03 DIAGNOSIS — R5081 Fever presenting with conditions classified elsewhere: Secondary | ICD-10-CM | POA: Diagnosis not present

## 2016-10-03 LAB — CULTURE, BLOOD (ROUTINE X 2)
CULTURE: NO GROWTH
Culture: NO GROWTH

## 2016-10-03 NOTE — Telephone Encounter (Signed)
Call received from patients daughter, Suanne Marker stating that she had to call EMS yesterday d/t patient's increased weakness.  EMS offered to transfer patient to the ED, but patient refused to go.  She states that patient has no appetite, fever up to 100.4, slight nausea and denies any SOB or pain.  Dr. Benay Spice notified and he would like for pt to come in to be seen in [facility] today  if patient is able.  Call placed back to patient's daughter Suanne Marker and she states that she will bring patient in to be seen and they can be here by 11:30AM.  [facility] aware and message sent to scheduling.

## 2016-10-03 NOTE — Progress Notes (Unsigned)
re

## 2016-10-03 NOTE — Progress Notes (Signed)
Opened in error

## 2016-10-03 NOTE — Progress Notes (Signed)
SYMPTOM MANAGEMENT CLINIC    Chief Complaint: Weakness and fevers  HPI:  Crystal Small 81 y.o. female diagnosed with pancreatic cancer with metastasis to the liver. Most recently received her first cycle of FOLFOX on 09/26/2016. The patient was recently discharged from the hospital earlier this week. She was admitted for sepsis, UTI, fever, and leukocytosis. During her hospitalization she was placed on vancomycin and Rocephin. Urine culture and blood cultures to date were negative. However, she had a culture from August 2017 which showed Enterococcus faecalis which was sensitive to amoxicillin. She was discharged home with 5 more days of amoxicillin to complete. Leukocytosis was improving during her hospitalization. The patient's daughter asked for the patient to be seen today due to ongoing weakness. Reports that her mother was not getting out of bed and not eating this past Tuesday which was the day after discharge from the hospital. She had a fever up to 100.4 that day. Tylenol was given and her fever resolved. Also on that date she slipped on water in the bathroom and bruised her left knee. Yesterday, this patient also was and had a fever up to 100. Her daughter could not get her to get up so EMS was called. All awaiting EMS, she was given Tylenol and her fever broke. She declined transport to the emergency room.   Today the patient tells me that she is feeling better. This morning she ate well including Cheerios, coffee, and ice cream. She is able to walk around her house without any difficulty. She does rest on occasion. She had a fever earlier this morning at 2 AM up to 100. She took Tylenol which resolved the fever. Patient denies chest pain, shortness of breath, abdominal pain, nausea, vomiting. No diarrhea. He is taking her amoxicillin as ordered. Patient's daughter has requested a walker with wheels and his seat as well as a raised seat for her commode. The patient will be moving in with her  daughter next week.   No history exists.    Review of Systems  Constitutional: Positive for fever and malaise/fatigue. Negative for chills and weight loss.  HENT: Negative.   Eyes: Negative.   Respiratory: Negative.   Cardiovascular: Negative.   Gastrointestinal: Negative.   Genitourinary: Negative.   Musculoskeletal: Positive for falls.  Skin: Negative.   Neurological: Positive for weakness. Negative for dizziness, tremors and focal weakness.  Endo/Heme/Allergies: Negative.   Psychiatric/Behavioral: Negative.     Past Medical History:  Diagnosis Date  . Cancer [facility])    pancreatic cancer   . GERD (gastroesophageal reflux disease)   . Hemorrhoids   . Hyperlipidemia   . Pancreatic mass     Past Surgical History:  Procedure Laterality Date  . ABDOMINAL HYSTERECTOMY  1990   COMPLETE  . COLONOSCOPY  2006  . EUS N/A 06/27/2016   Procedure: ESOPHAGEAL ENDOSCOPIC ULTRASOUND (EUS) RADIAL;  Surgeon: Milus Banister, MD;  Location: [facility] ENDOSCOPY;  Service: Endoscopy;  Laterality: N/A;  . IR GENERIC HISTORICAL  07/10/2016   IR US GUIDE VASC ACCESS RIGHT 07/10/2016 Jacqulynn Cadet, MD [facility]INTER[facility] RAD  . IR GENERIC HISTORICAL  07/10/2016   IR FLUORO GUIDE CV LINE RIGHT 07/10/2016 Jacqulynn Cadet, MD [facility]  . LIVER BIOPSY  06/19/2016   WITH ULTRASOUND  . PORTACATH PLACEMENT      has Cancer of pancreas, body ([place]); Port catheter in place; Weakness generalized; UTI (urinary tract infection); Fever; Sepsis ([place]); Leukocytosis; and Protein-calorie malnutrition, severe on her problem list.  has No Known Allergies.    Medication List       Accurate as of 10/03/16 12:42 PM. Always use your most recent med list.          acetaminophen 325 MG tablet Commonly known as:  TYLENOL Take 325 mg by mouth every 6 (six) hours as needed for fever.   ALPRAZolam 0.25 MG tablet Commonly known as:  XANAX Take 1 tablet (0.25 mg total) by mouth at bedtime as needed for sleep.     amoxicillin 500 MG capsule Commonly known as:  AMOXIL Take 1 capsule (500 mg total) by mouth 3 (three) times daily.   HYDROcodone-acetaminophen 5-325 MG tablet Commonly known as:  NORCO/VICODIN Take 1 tablet by mouth every 4 (four) hours as needed for moderate pain.   lidocaine-prilocaine cream Commonly known as:  EMLA Apply 1 application topically as needed (prior to accessing port).   nystatin 100000 UNIT/ML suspension Commonly known as:  MYCOSTATIN Take 5 mLs (500,000 Units total) by mouth 4 (four) times daily.   omeprazole 40 MG capsule Commonly known as:  PRILOSEC Take 40 mg by mouth daily.   ondansetron 8 MG tablet Commonly known as:  ZOFRAN Take 1 tablet (8 mg total) by mouth every 8 (eight) hours as needed for nausea or vomiting.   potassium chloride SA 20 MEQ tablet Commonly known as:  K-DUR,KLOR-CON Take 1 tablet (20 mEq total) by mouth 2 (two) times daily.   prochlorperazine 5 MG tablet Commonly known as:  COMPAZINE Take 1 tablet (5 mg total) by mouth every 6 (six) hours as needed for nausea or vomiting.        PHYSICAL EXAMINATION  Oncology Vitals 10/03/2016 09/30/2016  Height 165 cm -  Weight 50.259 kg -  Weight (lbs) 110 lbs 13 oz -  BMI (kg/m2) 18.44 kg/m2 -  Temp 97.5 98.8  Pulse 79 84  Resp 17 18  SpO2 99 97  BSA (m2) 1.52 m2 -   BP Readings from Last 2 Encounters:  10/03/16 (!) 109/54  09/30/16 140/62    Physical Exam  Constitutional: She is oriented to person, place, and time and well-developed, well-nourished, and in no distress. Vital signs are normal.  HENT:  Head: Normocephalic and atraumatic.  Mouth/Throat: Oropharynx is clear and moist. No oropharyngeal exudate.  Eyes: Conjunctivae and EOM are normal. Pupils are equal, round, and reactive to light. Right eye exhibits no discharge. Left eye exhibits no discharge. No scleral icterus.  Neck: Normal range of motion. Neck supple. No tracheal deviation present. No thyromegaly present.   Cardiovascular: Normal rate, regular rhythm, normal heart sounds and intact distal pulses.   Pulmonary/Chest: Effort normal and breath sounds normal. No respiratory distress. She has no wheezes.  Abdominal: Soft. Bowel sounds are normal. She exhibits no distension and no mass. There is no guarding.  Musculoskeletal: Normal range of motion. She exhibits edema.  Trace ankle edema  Lymphadenopathy:    She has no cervical adenopathy.  Neurological: She is alert and oriented to person, place, and time.  Skin: Skin is warm and dry.  Psychiatric: Mood, memory, affect and judgment normal.  Vitals reviewed.   LABORATORY DATA:. No visits with results within 3 Day(s) from this visit.  Latest known visit with results is:  Admission on 09/28/2016, Discharged on 09/30/2016  Component Date Value Ref Range Status  . WBC 09/28/2016 29.0* 4.0 - 10.5 K/uL Final  . RBC 09/28/2016 3.00* 3.87 - 5.11 MIL/uL Final  . Hemoglobin 09/28/2016 9.6* 12.0 -  15.0 g/dL Final  . HCT 09/28/2016 28.4* 36.0 - 46.0 % Final  . MCV 09/28/2016 94.7  78.0 - 100.0 fL Final  . MCH 09/28/2016 32.0  26.0 - 34.0 pg Final  . MCHC 09/28/2016 33.8  30.0 - 36.0 g/dL Final  . RDW 09/28/2016 16.5* 11.5 - 15.5 % Final  . Platelets 09/28/2016 257  150 - 400 K/uL Final  . Neutrophils Relative % 09/28/2016 93  % Final  . Lymphocytes Relative 09/28/2016 3  % Final  . Monocytes Relative 09/28/2016 2  % Final  . Eosinophils Relative 09/28/2016 2  % Final  . Basophils Relative 09/28/2016 0  % Final  . Neutro Abs 09/28/2016 26.9* 1.7 - 7.7 K/uL Final  . Lymphs Abs 09/28/2016 0.9  0.7 - 4.0 K/uL Final  . Monocytes Absolute 09/28/2016 0.6  0.1 - 1.0 K/uL Final  . Eosinophils Absolute 09/28/2016 0.6  0.0 - 0.7 K/uL Final  . Basophils Absolute 09/28/2016 0.0  0.0 - 0.1 K/uL Final  . WBC Morphology 09/28/2016 WHITE COUNT CONFIRMED ON SMEAR   Final  . Sodium 09/28/2016 137  135 - 145 mmol/L Final  . Potassium 09/28/2016 3.6  3.5 - 5.1 mmol/L  Final  . Chloride 09/28/2016 98* 101 - 111 mmol/L Final  . BUN 09/28/2016 28* 6 - 20 mg/dL Final  . Creatinine, Ser 09/28/2016 0.60  0.44 - 1.00 mg/dL Final  . Glucose, Bld 09/28/2016 154* 65 - 99 mg/dL Final  . Calcium, Ion 09/28/2016 1.29  1.15 - 1.40 mmol/L Final  . TCO2 09/28/2016 27  0 - 100 mmol/L Final  . Hemoglobin 09/28/2016 10.9* 12.0 - 15.0 g/dL Final  . HCT 09/28/2016 32.0* 36.0 - 46.0 % Final  . Sodium 09/28/2016 135  135 - 145 mmol/L Final  . Potassium 09/28/2016 3.6  3.5 - 5.1 mmol/L Final  . Chloride 09/28/2016 101  101 - 111 mmol/L Final  . CO2 09/28/2016 27  22 - 32 mmol/L Final  . Glucose, Bld 09/28/2016 157* 65 - 99 mg/dL Final  . BUN 09/28/2016 31* 6 - 20 mg/dL Final  . Creatinine, Ser 09/28/2016 0.61  0.44 - 1.00 mg/dL Final  . Calcium 09/28/2016 9.4  8.9 - 10.3 mg/dL Final  . Total Protein 09/28/2016 6.0* 6.5 - 8.1 g/dL Final  . Albumin 09/28/2016 2.9* 3.5 - 5.0 g/dL Final  . AST 09/28/2016 55* 15 - 41 U/L Final  . ALT 09/28/2016 61* 14 - 54 U/L Final  . Alkaline Phosphatase 09/28/2016 101  38 - 126 U/L Final  . Total Bilirubin 09/28/2016 0.9  0.3 - 1.2 mg/dL Final  . GFR calc non Af Amer 09/28/2016 >60  >60 mL/min Final  . GFR calc Af Amer 09/28/2016 >60  >60 mL/min Final   Comment: (NOTE) The eGFR has been calculated using the CKD EPI equation. This calculation has not been validated in all clinical situations. eGFR's persistently <60 mL/min signify possible Chronic Kidney Disease.   . Anion gap 09/28/2016 7  5 - 15 Final  . Color, Urine 09/28/2016 YELLOW  YELLOW Final  . APPearance 09/28/2016 CLOUDY* CLEAR Final  . Specific Gravity, Urine 09/28/2016 1.017  1.005 - 1.030 Final  . pH 09/28/2016 7.0  5.0 - 8.0 Final  . Glucose, UA 09/28/2016 NEGATIVE  NEGATIVE mg/dL Final  . Hgb urine dipstick 09/28/2016 NEGATIVE  NEGATIVE Final  . Bilirubin Urine 09/28/2016 NEGATIVE  NEGATIVE Final  . Ketones, ur 09/28/2016 NEGATIVE  NEGATIVE mg/dL Final  . Protein, ur  09/28/2016  NEGATIVE  NEGATIVE mg/dL Final  . Nitrite 09/28/2016 NEGATIVE  NEGATIVE Final  . Leukocytes, UA 09/28/2016 NEGATIVE  NEGATIVE Final  . Specimen Description 09/29/2016 URINE, CLEAN CATCH   Final  . Special Requests 09/29/2016 NONE   Final  . Culture 09/29/2016    Final                   Value:NO GROWTH Performed at [facility]   . Report Status 09/29/2016 09/29/2016 FINAL   Final  . Lipase 09/28/2016 51  11 - 51 U/L Final  . Specimen Description 10/02/2016 BLOOD LEFT ARM   Final  . Special Requests 10/02/2016 BOTTLES DRAWN AEROBIC AND ANAEROBIC 5CC   Final  . Culture 10/02/2016    Final                   Value:NO GROWTH 4 DAYS Performed at [facility]   . Report Status 10/02/2016 PENDING   Incomplete  . Specimen Description 10/02/2016 BLOOD RIGHT ARM   Final  . Special Requests 10/02/2016 BOTTLES DRAWN AEROBIC AND ANAEROBIC 5CC   Final  . Culture 10/02/2016    Final                   Value:NO GROWTH 4 DAYS Performed at [facility]   . Report Status 10/02/2016 PENDING   Incomplete  . Lactic Acid, Venous 09/28/2016 2.50* 0.5 - 1.9 mmol/L Final  . Prothrombin Time 09/28/2016 15.3* 11.4 - 15.2 seconds Final  . INR 09/28/2016 1.20   Final  . TSH 09/28/2016 1.715  0.350 - 4.500 uIU/mL Final  . Hgb A1c MFr Bld 09/29/2016 5.4  4.8 - 5.6 % Final   Comment: (NOTE)         Pre-diabetes: 5.7 - 6.4         Diabetes: >6.4         Glycemic control for adults with diabetes: <7.0   . Mean Plasma Glucose 09/29/2016 108  mg/dL Final   Comment: (NOTE) Performed At: [facility] [place], [place] [id number] Lindon Romp MD NO:[id number]   . Sodium 09/29/2016 138  135 - 145 mmol/L Final  . Potassium 09/29/2016 3.0* 3.5 - 5.1 mmol/L Final  . Chloride 09/29/2016 106  101 - 111 mmol/L Final  . CO2 09/29/2016 25  22 - 32 mmol/L Final  . Glucose, Bld 09/29/2016 127* 65 - 99 mg/dL Final  . BUN 09/29/2016 18  6 - 20 mg/dL Final  .  Creatinine, Ser 09/29/2016 0.48  0.44 - 1.00 mg/dL Final  . Calcium 09/29/2016 8.1* 8.9 - 10.3 mg/dL Final  . GFR calc non Af Amer 09/29/2016 >60  >60 mL/min Final  . GFR calc Af Amer 09/29/2016 >60  >60 mL/min Final   Comment: (NOTE) The eGFR has been calculated using the CKD EPI equation. This calculation has not been validated in all clinical situations. eGFR's persistently <60 mL/min signify possible Chronic Kidney Disease.   . Anion gap 09/29/2016 7  5 - 15 Final  . WBC 09/29/2016 25.2* 4.0 - 10.5 K/uL Final  . RBC 09/29/2016 3.22* 3.87 - 5.11 MIL/uL Final  . Hemoglobin 09/29/2016 10.2* 12.0 - 15.0 g/dL Final  . HCT 09/29/2016 30.9* 36.0 - 46.0 % Final  . MCV 09/29/2016 96.0  78.0 - 100.0 fL Final  . MCH 09/29/2016 31.7  26.0 - 34.0 pg Final  . MCHC 09/29/2016 33.0  30.0 - 36.0 g/dL Final  . RDW  09/29/2016 16.8* 11.5 - 15.5 % Final  . Platelets 09/29/2016 199  150 - 400 K/uL Final  . Magnesium 09/29/2016 2.0  1.7 - 2.4 mg/dL Final  . Sodium 09/30/2016 134* 135 - 145 mmol/L Final  . Potassium 09/30/2016 2.7* 3.5 - 5.1 mmol/L Final   Comment: CRITICAL RESULT CALLED TO, READ BACK BY AND VERIFIED WITH: HERNDON,F. RN AT 3244 09/30/16 MULLINS,T   . Chloride 09/30/2016 104  101 - 111 mmol/L Final  . CO2 09/30/2016 24  22 - 32 mmol/L Final  . Glucose, Bld 09/30/2016 101* 65 - 99 mg/dL Final  . BUN 09/30/2016 10  6 - 20 mg/dL Final  . Creatinine, Ser 09/30/2016 0.43* 0.44 - 1.00 mg/dL Final  . Calcium 09/30/2016 7.2* 8.9 - 10.3 mg/dL Final  . GFR calc non Af Amer 09/30/2016 >60  >60 mL/min Final  . GFR calc Af Amer 09/30/2016 >60  >60 mL/min Final   Comment: (NOTE) The eGFR has been calculated using the CKD EPI equation. This calculation has not been validated in all clinical situations. eGFR's persistently <60 mL/min signify possible Chronic Kidney Disease.   . Anion gap 09/30/2016 6  5 - 15 Final  . WBC 09/30/2016 16.6* 4.0 - 10.5 K/uL Final  . RBC 09/30/2016 2.78* 3.87 - 5.11  MIL/uL Final  . Hemoglobin 09/30/2016 8.7* 12.0 - 15.0 g/dL Final  . HCT 09/30/2016 25.9* 36.0 - 46.0 % Final  . MCV 09/30/2016 93.2  78.0 - 100.0 fL Final  . MCH 09/30/2016 31.3  26.0 - 34.0 pg Final  . MCHC 09/30/2016 33.6  30.0 - 36.0 g/dL Final  . RDW 09/30/2016 16.0* 11.5 - 15.5 % Final  . Platelets 09/30/2016 134* 150 - 400 K/uL Final  . Sodium 09/30/2016 134* 135 - 145 mmol/L Final  . Potassium 09/30/2016 4.2  3.5 - 5.1 mmol/L Final   Comment: RESULT REPEATED AND VERIFIED DELTA CHECK NOTED   . Chloride 09/30/2016 108  101 - 111 mmol/L Final  . CO2 09/30/2016 23  22 - 32 mmol/L Final  . Glucose, Bld 09/30/2016 142* 65 - 99 mg/dL Final  . BUN 09/30/2016 10  6 - 20 mg/dL Final  . Creatinine, Ser 09/30/2016 0.38* 0.44 - 1.00 mg/dL Final  . Calcium 09/30/2016 7.3* 8.9 - 10.3 mg/dL Final  . GFR calc non Af Amer 09/30/2016 >60  >60 mL/min Final  . GFR calc Af Amer 09/30/2016 >60  >60 mL/min Final   Comment: (NOTE) The eGFR has been calculated using the CKD EPI equation. This calculation has not been validated in all clinical situations. eGFR's persistently <60 mL/min signify possible Chronic Kidney Disease.   . Anion gap 09/30/2016 3* 5 - 15 Final    RADIOGRAPHIC STUDIES: No results found.  ASSESSMENT/PLAN:    No problem-specific Assessment & Plan notes found for this encounter.  This is a 81 year old female with metastatic pancreatic cancer. Seen today for weakness and fevers. The patient has been having fevers in the 100-100.5 range primarily during the night. Her fever responds well to Tylenol. Her fever is thought to be due to her cancer as cultures have been negative to date. Recommend that she take Aleve at bedtime to try to help her from having fevers. She may take this every 12 hours if needed.  The patient reports weakness the past couple of days but is better today. Suspect much of her weakness is due to recent chemotherapy. Her daughter reports that she did not sleep  very well due  to the dexamethasone premedication I will try to reduce the dose of this medication with her next cycle of chemotherapy. The timeline of her fatigue and weakness coincides with typical fatigue related to chemotherapy. Will review her chemotherapy dosages with her next cycle of chemotherapy.  A referral for advanced home care was placed for home safety evaluation. They can help [place] make recommendations for a walker and any other cream that she may need to the home.  Patient was seen with Dr. Benay Spice.  The patient will return next week as scheduled. Patient stated understanding of all instructions; and was in agreement with this plan of care. The patient knows to call the clinic with any problems, questions or concerns.   Mikey Bussing, NP 10/03/2016  This was a shared visit with Mikey Bussing. Ms. Josey continues to have low-grade fever. May be related to "tumor fever ". She will try naproxen prophylaxis. Cultures were negative during the recent hospital admission. We will see her prior to scheduled chemotherapy next week.  Julieanne Manson, M.D.

## 2016-10-06 ENCOUNTER — Other Ambulatory Visit: Payer: Self-pay | Admitting: Oncology

## 2016-10-07 ENCOUNTER — Telehealth: Payer: Self-pay | Admitting: *Deleted

## 2016-10-07 NOTE — Telephone Encounter (Signed)
Call received from [facility] with [facility] to notify Dr. Benay Spice that patient has refused home health physical therapy and is "not sure why it was ordered".  Patient does have an appt with Dr. Benay Spice on Thursday, 10/17/16 and will discuss this with MD at appt.  If patient is agreeable to PT, new order will need to be placed per [facility].

## 2016-10-10 ENCOUNTER — Ambulatory Visit (HOSPITAL_BASED_OUTPATIENT_CLINIC_OR_DEPARTMENT_OTHER): Payer: Medicare Other

## 2016-10-10 ENCOUNTER — Encounter: Payer: Self-pay | Admitting: *Deleted

## 2016-10-10 ENCOUNTER — Other Ambulatory Visit (HOSPITAL_BASED_OUTPATIENT_CLINIC_OR_DEPARTMENT_OTHER): Payer: Medicare Other

## 2016-10-10 ENCOUNTER — Ambulatory Visit (HOSPITAL_BASED_OUTPATIENT_CLINIC_OR_DEPARTMENT_OTHER): Payer: Medicare Other | Admitting: Nurse Practitioner

## 2016-10-10 VITALS — BP 118/57 | HR 77 | Temp 97.9°F | Resp 16 | Ht 65.0 in | Wt 105.2 lb

## 2016-10-10 DIAGNOSIS — C787 Secondary malignant neoplasm of liver and intrahepatic bile duct: Secondary | ICD-10-CM

## 2016-10-10 DIAGNOSIS — E876 Hypokalemia: Secondary | ICD-10-CM | POA: Diagnosis not present

## 2016-10-10 DIAGNOSIS — C258 Malignant neoplasm of overlapping sites of pancreas: Secondary | ICD-10-CM | POA: Diagnosis not present

## 2016-10-10 DIAGNOSIS — Z5111 Encounter for antineoplastic chemotherapy: Secondary | ICD-10-CM | POA: Diagnosis not present

## 2016-10-10 DIAGNOSIS — C251 Malignant neoplasm of body of pancreas: Secondary | ICD-10-CM

## 2016-10-10 DIAGNOSIS — Z95828 Presence of other vascular implants and grafts: Secondary | ICD-10-CM

## 2016-10-10 LAB — COMPREHENSIVE METABOLIC PANEL
ALT: 20 U/L (ref 0–55)
AST: 20 U/L (ref 5–34)
Albumin: 2.3 g/dL — ABNORMAL LOW (ref 3.5–5.0)
Alkaline Phosphatase: 109 U/L (ref 40–150)
Anion Gap: 9 mEq/L (ref 3–11)
BUN: 8.2 mg/dL (ref 7.0–26.0)
CHLORIDE: 101 meq/L (ref 98–109)
CO2: 28 meq/L (ref 22–29)
CREATININE: 0.6 mg/dL (ref 0.6–1.1)
Calcium: 8.2 mg/dL — ABNORMAL LOW (ref 8.4–10.4)
EGFR: 88 mL/min/{1.73_m2} — ABNORMAL LOW (ref >90)
Glucose: 141 mg/dl — ABNORMAL HIGH (ref 70–140)
Sodium: 137 mEq/L (ref 136–145)
Total Bilirubin: 0.69 mg/dL (ref 0.20–1.20)
Total Protein: 5.4 g/dL — ABNORMAL LOW (ref 6.4–8.3)

## 2016-10-10 LAB — CBC WITH DIFFERENTIAL/PLATELET
BASO%: 1.4 % (ref 0.0–2.0)
BASOS ABS: 0.1 10*3/uL (ref 0.0–0.1)
EOS%: 3.8 % (ref 0.0–7.0)
Eosinophils Absolute: 0.1 10*3/uL (ref 0.0–0.5)
HCT: 26.9 % — ABNORMAL LOW (ref 34.8–46.6)
HGB: 8.8 g/dL — ABNORMAL LOW (ref 11.6–15.9)
LYMPH%: 26.6 % (ref 14.0–49.7)
MCH: 31.7 pg (ref 25.1–34.0)
MCHC: 32.7 g/dL (ref 31.5–36.0)
MCV: 97.1 fL (ref 79.5–101.0)
MONO#: 1.1 10*3/uL — ABNORMAL HIGH (ref 0.1–0.9)
MONO%: 28.3 % — AB (ref 0.0–14.0)
NEUT#: 1.5 10*3/uL (ref 1.5–6.5)
NEUT%: 39.9 % (ref 38.4–76.8)
Platelets: 148 10*3/uL (ref 145–400)
RBC: 2.77 10*6/uL — AB (ref 3.70–5.45)
RDW: 16.7 % — ABNORMAL HIGH (ref 11.2–14.5)
WBC: 3.8 10*3/uL — ABNORMAL LOW (ref 3.9–10.3)
lymph#: 1 10*3/uL (ref 0.9–3.3)

## 2016-10-10 MED ORDER — HEPARIN SOD (PORK) LOCK FLUSH 100 UNIT/ML IV SOLN
250.0000 [IU] | Freq: Once | INTRAVENOUS | Status: DC | PRN
  Filled 2016-10-10: qty 5

## 2016-10-10 MED ORDER — HEPARIN SOD (PORK) LOCK FLUSH 100 UNIT/ML IV SOLN
500.0000 [IU] | Freq: Once | INTRAVENOUS | Status: DC | PRN
  Filled 2016-10-10: qty 5

## 2016-10-10 MED ORDER — DEXTROSE 5 % IV SOLN
85.0000 mg/m2 | Freq: Once | INTRAVENOUS | Status: AC
  Administered 2016-10-10: 125 mg via INTRAVENOUS
  Filled 2016-10-10: qty 5

## 2016-10-10 MED ORDER — SODIUM CHLORIDE 0.9% FLUSH
10.0000 mL | INTRAVENOUS | Status: DC | PRN
  Filled 2016-10-10: qty 10

## 2016-10-10 MED ORDER — SODIUM CHLORIDE 0.9 % IJ SOLN
10.0000 mL | INTRAMUSCULAR | Status: DC | PRN
  Administered 2016-10-10: 10 mL via INTRAVENOUS
  Filled 2016-10-10: qty 10

## 2016-10-10 MED ORDER — DEXAMETHASONE SODIUM PHOSPHATE 10 MG/ML IJ SOLN
5.0000 mg | Freq: Once | INTRAMUSCULAR | Status: AC
  Administered 2016-10-10: 5 mg via INTRAVENOUS

## 2016-10-10 MED ORDER — SODIUM CHLORIDE 0.9% FLUSH
3.0000 mL | Freq: Once | INTRAVENOUS | Status: DC | PRN
  Filled 2016-10-10: qty 10

## 2016-10-10 MED ORDER — PALONOSETRON HCL INJECTION 0.25 MG/5ML
INTRAVENOUS | Status: AC
  Filled 2016-10-10: qty 5

## 2016-10-10 MED ORDER — FLUOROURACIL CHEMO INJECTION 2.5 GM/50ML
400.0000 mg/m2 | Freq: Once | INTRAVENOUS | Status: AC
  Administered 2016-10-10: 600 mg via INTRAVENOUS
  Filled 2016-10-10: qty 12

## 2016-10-10 MED ORDER — ALTEPLASE 2 MG IJ SOLR
2.0000 mg | Freq: Once | INTRAMUSCULAR | Status: DC | PRN
  Filled 2016-10-10: qty 2

## 2016-10-10 MED ORDER — PALONOSETRON HCL INJECTION 0.25 MG/5ML
0.2500 mg | Freq: Once | INTRAVENOUS | Status: AC
  Administered 2016-10-10: 0.25 mg via INTRAVENOUS

## 2016-10-10 MED ORDER — POTASSIUM CHLORIDE CRYS ER 20 MEQ PO TBCR: EXTENDED_RELEASE_TABLET | ORAL | 0 refills | Status: DC

## 2016-10-10 MED ORDER — SODIUM CHLORIDE 0.9 % IV SOLN
2400.0000 mg/m2 | INTRAVENOUS | Status: DC
  Administered 2016-10-10: 3600 mg via INTRAVENOUS
  Filled 2016-10-10: qty 72

## 2016-10-10 MED ORDER — ALPRAZOLAM 0.25 MG PO TABS: 0.2500 mg | ORAL_TABLET | Freq: Every evening | ORAL | 0 refills | Status: DC | PRN

## 2016-10-10 MED ORDER — DEXAMETHASONE SODIUM PHOSPHATE 10 MG/ML IJ SOLN
INTRAMUSCULAR | Status: AC
  Filled 2016-10-10: qty 1

## 2016-10-10 MED ORDER — DEXTROSE 5 % IV SOLN
Freq: Once | INTRAVENOUS | Status: AC
  Administered 2016-10-10: 11:00:00 via INTRAVENOUS

## 2016-10-10 MED ORDER — DEXTROSE 5 % IV SOLN
400.0000 mg/m2 | Freq: Once | INTRAVENOUS | Status: AC
  Administered 2016-10-10: 596 mg via INTRAVENOUS
  Filled 2016-10-10: qty 29.8

## 2016-10-10 NOTE — Patient Instructions (Signed)

## 2016-10-10 NOTE — Patient Instructions (Signed)
[facility] Discharge Instructions for Patients Receiving Chemotherapy  Today you received the following chemotherapy agents:  Oxaliplatin, Leucovorin, Fluorouracil  To help prevent nausea and vomiting after your treatment, we encourage you to take your nausea medication as prescribed.   If you develop nausea and vomiting that is not controlled by your nausea medication, call the clinic.   BELOW ARE SYMPTOMS THAT SHOULD BE REPORTED IMMEDIATELY:  *FEVER GREATER THAN 100.5 F  *CHILLS WITH OR WITHOUT FEVER  NAUSEA AND VOMITING THAT IS NOT CONTROLLED WITH YOUR NAUSEA MEDICATION  *UNUSUAL SHORTNESS OF BREATH  *UNUSUAL BRUISING OR BLEEDING  TENDERNESS IN MOUTH AND THROAT WITH OR WITHOUT PRESENCE OF ULCERS  *URINARY PROBLEMS  *BOWEL PROBLEMS  UNUSUAL RASH Items with * indicate a potential emergency and should be followed up as soon as possible.  Feel free to call the clinic you have any questions or concerns. The clinic phone number is [phone].  Please show the CHEMO ALERT CARD at check-in to the Emergency Department and triage nurse.   

## 2016-10-10 NOTE — Progress Notes (Signed)
Oncology Nurse Navigator Documentation  Oncology Nurse Navigator Flowsheets 10/10/2016  Navigator Location [facility]-  Referral date to RadOnc/MedOnc -  Navigator Encounter Type Treatment  Telephone -  Abnormal Finding Date -  Confirmed Diagnosis Date -  Treatment Initiated Date -  Patient Visit Type MedOnc  Treatment Phase Active Tx--FOLFOX #2  Barriers/Navigation Needs Education  Education Pain/ Symptom Management--bLE edema  Interventions Education--elevation, increase protein intake, less sodium  Referrals -  Coordination of Care -  Education Method Teach-back  Support Groups/Services -  Acuity -  Time Spent with Patient 15  Met with patient and her daughter in tx area today. Rori has ordered a Sleep Member bed and when this arrives it will be put in her daughter's apartment and she will move in. Currently her sister is staying with her in her home. Says overall she is doing well at this time. No navigation needs expressed.

## 2016-10-10 NOTE — Progress Notes (Signed)
Crystal Small   Diagnosis:  Pancreas cancer  INTERVAL HISTORY:   Crystal Small returns as scheduled. She completed cycle 1 FOLFOX 09/26/2016. She was hospitalized 09/28/2016 through 09/30/2016 with presenting symptoms of weakness and fever. She was treated for a urinary tract infection. She was seen in the symptom management clinic 10/03/2016 with similar symptoms. The persistent low-grade fevers felt to possibly be related to "tumor fever". She was started on naproxen prophylaxis.  She has had no further fevers. She has discontinued naproxen. She denies nausea/vomiting. No mouth sores. She had several loose stools earlier in the week, about 4 days ago. Bowel habits have since returned to baseline. She had mild cold sensitivity following the first cycle of FOLFOX. She describes her appetite as "normal". She continues to Small an alteration in taste. Energy level is better. Her daughter notes that she no longer seems to be in a "fog".  Objective:  Vital signs in last 24 hours:  Blood pressure (!) 118/57, pulse 77, temperature 97.9 F (36.6 C), temperature source Oral, resp. rate 16, height 5\' 5"  (1.651 m), weight 105 lb 3.2 oz (47.7 kg), SpO2 100 %.    HEENT: No thrush or ulcers. Resp: Lungs clear bilaterally. Cardio: Regular rate and rhythm. GI: Abdomen soft and nontender. No hepatomegaly. No mass. Vascular: Pitting edema at the lower legs bilaterally. Neuro: Alert and oriented.  Skin: No rash. Port-A-Cath without erythema.    Lab Results:  Lab Results  Component Value Date   WBC 3.8 (L) 10/10/2016   HGB 8.8 (L) 10/10/2016   HCT 26.9 (L) 10/10/2016   MCV 97.1 10/10/2016   PLT 148 10/10/2016   NEUTROABS 1.5 10/10/2016    Imaging:  No results found.  Medications: I have reviewed the patient's current medications.  Assessment/Plan: 1. 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 Primary lesion involving the tail of the pancreas decreased in size.  Cycle 1 FOLFOX 09/26/2016  Cycle 2 FOLFOX 10/10/2016  2. Left abdominal pain secondary to #1. Improved  3. Anorexia/weight loss  4. Status post evaluation in the emergency department 07/13/2016 for fever and confusion. Urine culture showed enterococcus. She completed a course of amoxicillin.  5.   Hypercalcemia, status post Zometa 09/26/2016  6.   Hospitalization 09/28/2016 through 10/01/2015 presenting with weakness and fever. Diagnosed with UTI.  7.    Low-grade fevers, question "tumor fever". Denies fever 10/10/2016.   Disposition: Crystal Small appears stable. She has completed 1 cycle of FOLFOX. Plan to proceed with cycle 2 today as scheduled.  The neutrophil count is in low normal range. She will receive Neulasta on the day of pump discontinuation. We reviewed potential toxicities including bone pain, rash, splenic rupture. She is agreeable to proceed.  She is hypokalemic on labs today. This may be related to the recent loose stools. She will begin Kdur 20 meq twice daily for 3 days then 20 meq daily.  She will return for a follow-up visit and cycle 3 FOLFOX in 2 weeks.  She will contact the office in the interim with any problems.  Plan reviewed with Dr. Benay Spice. 25 minutes were spent face-to-face at today's visit with the  majority of that time involved in counseling/coordination of care.  Ned Card ANP/GNP-BC   10/10/2016  10:03 AM

## 2016-10-11 ENCOUNTER — Other Ambulatory Visit: Payer: Self-pay | Admitting: *Deleted

## 2016-10-12 ENCOUNTER — Ambulatory Visit (HOSPITAL_BASED_OUTPATIENT_CLINIC_OR_DEPARTMENT_OTHER): Payer: Medicare Other

## 2016-10-12 VITALS — BP 113/53 | HR 80 | Temp 98.3°F | Resp 16

## 2016-10-12 DIAGNOSIS — C251 Malignant neoplasm of body of pancreas: Secondary | ICD-10-CM

## 2016-10-12 DIAGNOSIS — C258 Malignant neoplasm of overlapping sites of pancreas: Secondary | ICD-10-CM

## 2016-10-12 MED ORDER — PEGFILGRASTIM INJECTION 6 MG/0.6ML ~~LOC~~
6.0000 mg | PREFILLED_SYRINGE | Freq: Once | SUBCUTANEOUS | Status: AC
  Administered 2016-10-12: 6 mg via SUBCUTANEOUS

## 2016-10-12 MED ORDER — HEPARIN SOD (PORK) LOCK FLUSH 100 UNIT/ML IV SOLN
500.0000 [IU] | Freq: Once | INTRAVENOUS | Status: AC | PRN
  Administered 2016-10-12: 500 [IU]
  Filled 2016-10-12: qty 5

## 2016-10-12 MED ORDER — SODIUM CHLORIDE 0.9% FLUSH
10.0000 mL | INTRAVENOUS | Status: DC | PRN
  Administered 2016-10-12: 10 mL
  Filled 2016-10-12: qty 10

## 2016-10-12 NOTE — Patient Instructions (Signed)
Pegfilgrastim injection What is this medicine? PEGFILGRASTIM (PEG fil gra stim) is a long-acting granulocyte colony-stimulating factor that stimulates the growth of neutrophils, a type of white blood cell important in the body's fight against infection. It is used to reduce the incidence of fever and infection in patients with certain types of cancer who are receiving chemotherapy that affects the bone marrow, and to increase survival after being exposed to high doses of radiation. This medicine may be used for other purposes; ask your health care provider or pharmacist if you have questions. COMMON BRAND NAME(S): Neulasta What should I tell my health care provider before I take this medicine? They need to know if you have any of these conditions: -kidney disease -latex allergy -ongoing radiation therapy -sickle cell disease -skin reactions to acrylic adhesives (On-Body Injector only) -an unusual or allergic reaction to pegfilgrastim, filgrastim, other medicines, foods, dyes, or preservatives -pregnant or trying to get pregnant -breast-feeding How should I use this medicine? This medicine is for injection under the skin. If you get this medicine at home, you will be taught how to prepare and give the pre-filled syringe or how to use the On-body Injector. Refer to the patient Instructions for Use for detailed instructions. Use exactly as directed. Take your medicine at regular intervals. Do not take your medicine more often than directed. It is important that you put your used needles and syringes in a special sharps container. Do not put them in a trash can. If you do not have a sharps container, call your pharmacist or healthcare provider to get one. Talk to your pediatrician regarding the use of this medicine in children. While this drug may be prescribed for selected conditions, precautions do apply. Overdosage: If you think you have taken too much of this medicine contact a poison control  center or emergency room at once. NOTE: This medicine is only for you. Do not share this medicine with others. What if I miss a dose? It is important not to miss your dose. Call your doctor or health care professional if you miss your dose. If you miss a dose due to an On-body Injector failure or leakage, a new dose should be administered as soon as possible using a single prefilled syringe for manual use. What may interact with this medicine? Interactions have not been studied. Give your health care provider a list of all the medicines, herbs, non-prescription drugs, or dietary supplements you use. Also tell them if you smoke, drink alcohol, or use illegal drugs. Some items may interact with your medicine. This list may not describe all possible interactions. Give your health care provider a list of all the medicines, herbs, non-prescription drugs, or dietary supplements you use. Also tell them if you smoke, drink alcohol, or use illegal drugs. Some items may interact with your medicine. What should I watch for while using this medicine? You may need blood work done while you are taking this medicine. If you are going to need a MRI, CT scan, or other procedure, tell your doctor that you are using this medicine (On-Body Injector only). What side effects may I notice from receiving this medicine? Side effects that you should report to your doctor or health care professional as soon as possible: -allergic reactions like skin rash, itching or hives, swelling of the face, lips, or tongue -dizziness -fever -pain, redness, or irritation at site where injected -pinpoint red spots on the skin -red or dark-brown urine -shortness of breath or breathing problems -stomach or   side pain, or pain at the shoulder -swelling -tiredness -trouble passing urine or change in the amount of urine Side effects that usually do not require medical attention (report to your doctor or health care professional if they  continue or are bothersome): -bone pain -muscle pain This list may not describe all possible side effects. Call your doctor for medical advice about side effects. You may report side effects to FDA at 1-800-FDA-1088. Where should I keep my medicine? Keep out of the reach of children. Store pre-filled syringes in a refrigerator between 2 and 8 degrees C (36 and 46 degrees F). Do not freeze. Keep in carton to protect from light. Throw away this medicine if it is left out of the refrigerator for more than 48 hours. Throw away any unused medicine after the expiration date. NOTE: This sheet is a summary. It may not cover all possible information. If you have questions about this medicine, talk to your doctor, pharmacist, or health care provider.  2017 Elsevier/Gold Standard (2014-12-01 14:30:14)  

## 2016-10-14 ENCOUNTER — Encounter: Payer: Self-pay | Admitting: Oncology

## 2016-10-14 ENCOUNTER — Telehealth: Payer: Self-pay | Admitting: *Deleted

## 2016-10-14 DIAGNOSIS — C251 Malignant neoplasm of body of pancreas: Secondary | ICD-10-CM

## 2016-10-14 MED ORDER — POTASSIUM CHLORIDE 20 MEQ/15ML (10%) PO SOLN: 20.0000 meq | Freq: Every day | ORAL | 0 refills | Status: DC

## 2016-10-14 NOTE — Telephone Encounter (Signed)
Message received from [place] with [employer] to clarify need for Xanax.  Call placed back to [place] to inform her that Xanax prescription has been written for sleep by L. Marcello Moores NP.

## 2016-10-14 NOTE — Telephone Encounter (Signed)
Call received from patient's daughter Suanne Marker requesting potassium in liquid form and would like to know if patient's treatment can be given every 3 weeks to give patient a break.  Will inform Dr. Benay Spice of Rhonda's requests.

## 2016-10-14 NOTE — Progress Notes (Signed)
Submitted auth request for Alprazolam.

## 2016-10-15 ENCOUNTER — Encounter: Payer: Self-pay | Admitting: *Deleted

## 2016-10-15 ENCOUNTER — Telehealth: Payer: Self-pay | Admitting: Oncology

## 2016-10-15 ENCOUNTER — Encounter: Payer: Self-pay | Admitting: Oncology

## 2016-10-15 NOTE — Progress Notes (Signed)
Per Dr. Benay Spice, [place] to move patient's chemo treatments to every 3 weeks per pt.'s daughters request.  Message sent to schedulers and call placed to patient's daughter of MD order.

## 2016-10-15 NOTE — Progress Notes (Signed)
Auth for Alprazolam was approved from 10/14/16 - 10/14/17.

## 2016-10-15 NOTE — Telephone Encounter (Signed)
lvm for pt dtr to inform her of her new appt date/time on 12/7

## 2016-10-23 ENCOUNTER — Ambulatory Visit: Payer: Medicare Other

## 2016-10-23 ENCOUNTER — Ambulatory Visit: Payer: Medicare Other | Admitting: Nurse Practitioner

## 2016-10-23 ENCOUNTER — Other Ambulatory Visit: Payer: Medicare Other

## 2016-10-31 ENCOUNTER — Telehealth: Payer: Self-pay | Admitting: Nurse Practitioner

## 2016-10-31 ENCOUNTER — Ambulatory Visit (HOSPITAL_BASED_OUTPATIENT_CLINIC_OR_DEPARTMENT_OTHER): Payer: Medicare Other

## 2016-10-31 ENCOUNTER — Ambulatory Visit (HOSPITAL_BASED_OUTPATIENT_CLINIC_OR_DEPARTMENT_OTHER): Payer: Medicare Other | Admitting: Nurse Practitioner

## 2016-10-31 ENCOUNTER — Other Ambulatory Visit (HOSPITAL_BASED_OUTPATIENT_CLINIC_OR_DEPARTMENT_OTHER): Payer: Medicare Other

## 2016-10-31 ENCOUNTER — Telehealth: Payer: Self-pay | Admitting: *Deleted

## 2016-10-31 VITALS — BP 134/66 | HR 75 | Temp 98.5°F | Resp 18 | Ht 65.0 in | Wt 102.2 lb

## 2016-10-31 DIAGNOSIS — C258 Malignant neoplasm of overlapping sites of pancreas: Secondary | ICD-10-CM

## 2016-10-31 DIAGNOSIS — C787 Secondary malignant neoplasm of liver and intrahepatic bile duct: Secondary | ICD-10-CM

## 2016-10-31 DIAGNOSIS — G893 Neoplasm related pain (acute) (chronic): Secondary | ICD-10-CM

## 2016-10-31 DIAGNOSIS — C251 Malignant neoplasm of body of pancreas: Secondary | ICD-10-CM

## 2016-10-31 DIAGNOSIS — Z5111 Encounter for antineoplastic chemotherapy: Secondary | ICD-10-CM

## 2016-10-31 LAB — COMPREHENSIVE METABOLIC PANEL
ALBUMIN: 2.9 g/dL — AB (ref 3.5–5.0)
ALK PHOS: 134 U/L (ref 40–150)
ALT: 22 U/L (ref 0–55)
ANION GAP: 7 meq/L (ref 3–11)
AST: 26 U/L (ref 5–34)
BUN: 18.6 mg/dL (ref 7.0–26.0)
CALCIUM: 8.8 mg/dL (ref 8.4–10.4)
CO2: 26 mEq/L (ref 22–29)
CREATININE: 0.6 mg/dL (ref 0.6–1.1)
Chloride: 107 mEq/L (ref 98–109)
EGFR: 85 mL/min/{1.73_m2} — ABNORMAL LOW (ref >90)
Glucose: 158 mg/dl — ABNORMAL HIGH (ref 70–140)
Potassium: 4 mEq/L (ref 3.5–5.1)
Sodium: 139 mEq/L (ref 136–145)
Total Bilirubin: 0.33 mg/dL (ref 0.20–1.20)
Total Protein: 6.4 g/dL (ref 6.4–8.3)

## 2016-10-31 LAB — CBC WITH DIFFERENTIAL/PLATELET
BASO%: 1.6 % (ref 0.0–2.0)
Basophils Absolute: 0.1 10*3/uL (ref 0.0–0.1)
EOS ABS: 0.1 10*3/uL (ref 0.0–0.5)
EOS%: 0.9 % (ref 0.0–7.0)
HEMATOCRIT: 28.1 % — AB (ref 34.8–46.6)
HEMOGLOBIN: 9 g/dL — AB (ref 11.6–15.9)
LYMPH#: 1.7 10*3/uL (ref 0.9–3.3)
LYMPH%: 28.4 % (ref 14.0–49.7)
MCH: 32.1 pg (ref 25.1–34.0)
MCHC: 32 g/dL (ref 31.5–36.0)
MCV: 100.4 fL (ref 79.5–101.0)
MONO#: 0.7 10*3/uL (ref 0.1–0.9)
MONO%: 12.8 % (ref 0.0–14.0)
NEUT%: 56.3 % (ref 38.4–76.8)
NEUTROS ABS: 3.3 10*3/uL (ref 1.5–6.5)
PLATELETS: 232 10*3/uL (ref 145–400)
RBC: 2.8 10*6/uL — ABNORMAL LOW (ref 3.70–5.45)
RDW: 20.7 % — ABNORMAL HIGH (ref 11.2–14.5)
WBC: 5.8 10*3/uL (ref 3.9–10.3)
nRBC: 0 % (ref 0–0)

## 2016-10-31 MED ORDER — FLUOROURACIL CHEMO INJECTION 5 GM/100ML
2400.0000 mg/m2 | INTRAVENOUS | Status: DC
  Administered 2016-10-31: 3600 mg via INTRAVENOUS
  Filled 2016-10-31: qty 72

## 2016-10-31 MED ORDER — DEXAMETHASONE SODIUM PHOSPHATE 10 MG/ML IJ SOLN
5.0000 mg | Freq: Once | INTRAMUSCULAR | Status: AC
Start: 2016-10-31 — End: 2016-10-31
  Administered 2016-10-31: 5 mg via INTRAVENOUS

## 2016-10-31 MED ORDER — OXALIPLATIN CHEMO INJECTION 100 MG/20ML
85.0000 mg/m2 | Freq: Once | INTRAVENOUS | Status: AC
  Administered 2016-10-31: 125 mg via INTRAVENOUS
  Filled 2016-10-31: qty 20

## 2016-10-31 MED ORDER — PALONOSETRON HCL INJECTION 0.25 MG/5ML
INTRAVENOUS | Status: AC
Start: 2016-10-31 — End: 2016-10-31
  Filled 2016-10-31: qty 5

## 2016-10-31 MED ORDER — LEUCOVORIN CALCIUM INJECTION 350 MG
400.0000 mg/m2 | Freq: Once | INTRAVENOUS | Status: AC
  Administered 2016-10-31: 596 mg via INTRAVENOUS
  Filled 2016-10-31: qty 29.8

## 2016-10-31 MED ORDER — DEXTROSE 5 % IV SOLN
Freq: Once | INTRAVENOUS | Status: AC
  Administered 2016-10-31: 13:00:00 via INTRAVENOUS

## 2016-10-31 MED ORDER — FLUOROURACIL CHEMO INJECTION 2.5 GM/50ML
400.0000 mg/m2 | Freq: Once | INTRAVENOUS | Status: AC
  Administered 2016-10-31: 600 mg via INTRAVENOUS
  Filled 2016-10-31: qty 12

## 2016-10-31 MED ORDER — PALONOSETRON HCL INJECTION 0.25 MG/5ML
0.2500 mg | Freq: Once | INTRAVENOUS | Status: AC
  Administered 2016-10-31: 0.25 mg via INTRAVENOUS

## 2016-10-31 MED ORDER — DEXAMETHASONE SODIUM PHOSPHATE 10 MG/ML IJ SOLN
INTRAMUSCULAR | Status: AC
  Filled 2016-10-31: qty 1

## 2016-10-31 MED ORDER — SODIUM CHLORIDE 0.9 % IJ SOLN
10.0000 mL | Freq: Once | INTRAMUSCULAR | Status: AC
  Administered 2016-10-31: 10 mL via INTRAVENOUS
  Filled 2016-10-31: qty 10

## 2016-10-31 MED ORDER — HEPARIN SOD (PORK) LOCK FLUSH 100 UNIT/ML IV SOLN
500.0000 [IU] | Freq: Once | INTRAVENOUS | Status: DC | PRN
  Filled 2016-10-31: qty 5

## 2016-10-31 MED ORDER — SODIUM CHLORIDE 0.9% FLUSH
10.0000 mL | INTRAVENOUS | Status: DC | PRN
  Filled 2016-10-31: qty 10

## 2016-10-31 NOTE — Telephone Encounter (Signed)
Appointments scheduled per 12/7 LOS. Patient given AVS report and calendars with future scheduled appointments.  °

## 2016-10-31 NOTE — Patient Instructions (Signed)
[facility] Discharge Instructions for Patients Receiving Chemotherapy  Today you received the following chemotherapy agents:  Oxaliplatin, Leucovorin, Fluorouracil  To help prevent nausea and vomiting after your treatment, we encourage you to take your nausea medication as prescribed.   If you develop nausea and vomiting that is not controlled by your nausea medication, call the clinic.   BELOW ARE SYMPTOMS THAT SHOULD BE REPORTED IMMEDIATELY:  *FEVER GREATER THAN 100.5 F  *CHILLS WITH OR WITHOUT FEVER  NAUSEA AND VOMITING THAT IS NOT CONTROLLED WITH YOUR NAUSEA MEDICATION  *UNUSUAL SHORTNESS OF BREATH  *UNUSUAL BRUISING OR BLEEDING  TENDERNESS IN MOUTH AND THROAT WITH OR WITHOUT PRESENCE OF ULCERS  *URINARY PROBLEMS  *BOWEL PROBLEMS  UNUSUAL RASH Items with * indicate a potential emergency and should be followed up as soon as possible.  Feel free to call the clinic you have any questions or concerns. The clinic phone number is [phone].  Please show the CHEMO ALERT CARD at check-in to the Emergency Department and triage nurse.   

## 2016-10-31 NOTE — Telephone Encounter (Signed)
Per LOS I have scheduled appts and notified the scheduler 

## 2016-10-31 NOTE — Addendum Note (Signed)
Addended by: [name] B on: 10/31/2016 12:53 PM   Modules accepted: Orders

## 2016-10-31 NOTE — Progress Notes (Signed)
  [facility] OFFICE PROGRESS NOTE   Diagnosis:  Pancreas cancer  INTERVAL HISTORY:   Crystal Small returns as scheduled. She completed cycle 2 FOLFOX 10/10/2016. She denies nausea/vomiting. No mouth sores. No diarrhea. She has periodic mild cold sensitivity. She denies abdominal pain. No fever. She "fell into the wall" following her last treatment. She thinks the chemotherapy pump affected her balance. Back pain is improving.  Objective:  Vital signs in last 24 hours:  Blood pressure 134/66, pulse 75, temperature 98.5 F (36.9 C), temperature source Oral, resp. rate 18, height 5\' 5"  (1.651 m), weight 102 lb 3.2 oz (46.4 kg), SpO2 100 %.    HEENT: No thrush or ulcers. Resp: Lungs clear bilaterally. Cardio: Regular rate and rhythm. GI: Abdomen soft and nontender. No hepatomegaly. No mass. Vascular: No leg edema. Neuro: Vibratory sense mildly decreased over the fingertips per tuning fork exam.  Port-A-Cath without erythema.    Lab Results:  Lab Results  Component Value Date   WBC 5.8 10/31/2016   HGB 9.0 (L) 10/31/2016   HCT 28.1 (L) 10/31/2016   MCV 100.4 10/31/2016   PLT 232 10/31/2016   NEUTROABS 3.3 10/31/2016    Imaging:  No results found.  Medications: I have reviewed the patient's current medications.  Assessment/Plan: 1. 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 Primary lesion involving the tail of the pancreas decreased in size.  Cycle 1 FOLFOX 09/26/2016  Cycle 2 FOLFOX 10/10/2016  Cycle 3 FOLFOX 10/31/2016 (scheduled adjusted to every 3 weeks per patient request)  2. Left abdominal pain secondary to #1. Improved  3. Anorexia/weight loss  4. Status post evaluation in the emergency department 07/13/2016 for fever and confusion. Urine culture showed enterococcus. She completed a course of amoxicillin.  5. Hypercalcemia, status post Zometa 09/26/2016  6.   Hospitalization 09/28/2016 through 10/01/2015 presenting with weakness and fever. Diagnosed with UTI.  7.    Low-grade fevers, question "tumor fever". Denies fever 10/10/2016.    Disposition: Crystal Small appears stable. She has completed 2 cycles of FOLFOX. Plan to proceed with cycle 3 today as scheduled. At her request the treatment interval has been increased from every 2 weeks to every 3 weeks. She will return for a follow-up visit and cycle 4 FOLFOX in 3 weeks. She will contact the office in the interim with any problems.    Ned Card ANP/GNP-BC   10/31/2016  12:26 PM

## 2016-11-02 ENCOUNTER — Ambulatory Visit (HOSPITAL_BASED_OUTPATIENT_CLINIC_OR_DEPARTMENT_OTHER): Payer: Medicare Other

## 2016-11-02 ENCOUNTER — Ambulatory Visit: Payer: Medicare Other

## 2016-11-02 VITALS — BP 125/43 | HR 69 | Temp 98.8°F | Resp 18

## 2016-11-02 DIAGNOSIS — C258 Malignant neoplasm of overlapping sites of pancreas: Secondary | ICD-10-CM

## 2016-11-02 DIAGNOSIS — C251 Malignant neoplasm of body of pancreas: Secondary | ICD-10-CM

## 2016-11-02 MED ORDER — SODIUM CHLORIDE 0.9% FLUSH
10.0000 mL | INTRAVENOUS | Status: DC | PRN
  Administered 2016-11-02: 10 mL
  Filled 2016-11-02: qty 10

## 2016-11-02 MED ORDER — HEPARIN SOD (PORK) LOCK FLUSH 100 UNIT/ML IV SOLN
500.0000 [IU] | Freq: Once | INTRAVENOUS | Status: AC | PRN
  Administered 2016-11-02: 500 [IU]
  Filled 2016-11-02: qty 5

## 2016-11-02 MED ORDER — PEGFILGRASTIM INJECTION 6 MG/0.6ML ~~LOC~~
6.0000 mg | PREFILLED_SYRINGE | Freq: Once | SUBCUTANEOUS | Status: AC
  Administered 2016-11-02: 6 mg via SUBCUTANEOUS

## 2016-11-02 NOTE — Progress Notes (Signed)
Documentation complete on pump DC encounter.

## 2016-11-07 ENCOUNTER — Ambulatory Visit: Payer: Medicare Other | Admitting: Nurse Practitioner

## 2016-11-07 ENCOUNTER — Other Ambulatory Visit: Payer: Medicare Other

## 2016-11-07 ENCOUNTER — Ambulatory Visit: Payer: Medicare Other

## 2016-11-11 ENCOUNTER — Other Ambulatory Visit: Payer: Self-pay | Admitting: Oncology

## 2016-11-15 ENCOUNTER — Other Ambulatory Visit (HOSPITAL_BASED_OUTPATIENT_CLINIC_OR_DEPARTMENT_OTHER): Payer: Medicare Other

## 2016-11-15 ENCOUNTER — Telehealth: Payer: Self-pay | Admitting: *Deleted

## 2016-11-15 ENCOUNTER — Other Ambulatory Visit: Payer: Self-pay | Admitting: *Deleted

## 2016-11-15 ENCOUNTER — Other Ambulatory Visit: Payer: Self-pay | Admitting: Nurse Practitioner

## 2016-11-15 DIAGNOSIS — C251 Malignant neoplasm of body of pancreas: Secondary | ICD-10-CM

## 2016-11-15 LAB — COMPREHENSIVE METABOLIC PANEL
ALBUMIN: 3.4 g/dL — AB (ref 3.5–5.0)
ALK PHOS: 164 U/L — AB (ref 40–150)
ALT: 18 U/L (ref 0–55)
AST: 26 U/L (ref 5–34)
Anion Gap: 8 mEq/L (ref 3–11)
BUN: 9.9 mg/dL (ref 7.0–26.0)
CALCIUM: 9.3 mg/dL (ref 8.4–10.4)
CO2: 28 mEq/L (ref 22–29)
CREATININE: 0.7 mg/dL (ref 0.6–1.1)
Chloride: 103 mEq/L (ref 98–109)
EGFR: 78 mL/min/{1.73_m2} — ABNORMAL LOW (ref >90)
GLUCOSE: 125 mg/dL (ref 70–140)
POTASSIUM: 4.2 meq/L (ref 3.5–5.1)
SODIUM: 139 meq/L (ref 136–145)
TOTAL PROTEIN: 6.7 g/dL (ref 6.4–8.3)
Total Bilirubin: 0.52 mg/dL (ref 0.20–1.20)

## 2016-11-15 LAB — URINALYSIS, MICROSCOPIC - CHCC
Bilirubin (Urine): NEGATIVE
Glucose: NEGATIVE mg/dL
Ketones: NEGATIVE mg/dL
NITRITE: NEGATIVE
PROTEIN: 30 mg/dL
SPECIFIC GRAVITY, URINE: 1.015 (ref 1.003–1.035)
UROBILINOGEN UR: 0.2 mg/dL (ref 0.2–1)
pH: 6.5 (ref 4.6–8.0)

## 2016-11-15 LAB — CBC WITH DIFFERENTIAL/PLATELET
BASO%: 0.9 % (ref 0.0–2.0)
BASOS ABS: 0.1 10*3/uL (ref 0.0–0.1)
EOS%: 2.2 % (ref 0.0–7.0)
Eosinophils Absolute: 0.3 10*3/uL (ref 0.0–0.5)
HEMATOCRIT: 33.6 % — AB (ref 34.8–46.6)
HEMOGLOBIN: 10.8 g/dL — AB (ref 11.6–15.9)
LYMPH#: 1.9 10*3/uL (ref 0.9–3.3)
LYMPH%: 13.5 % — ABNORMAL LOW (ref 14.0–49.7)
MCH: 33 pg (ref 25.1–34.0)
MCHC: 32.1 g/dL (ref 31.5–36.0)
MCV: 102.8 fL — ABNORMAL HIGH (ref 79.5–101.0)
MONO#: 1.3 10*3/uL — ABNORMAL HIGH (ref 0.1–0.9)
MONO%: 8.7 % (ref 0.0–14.0)
NEUT%: 74.7 % (ref 38.4–76.8)
NEUTROS ABS: 10.7 10*3/uL — AB (ref 1.5–6.5)
Platelets: 138 10*3/uL — ABNORMAL LOW (ref 145–400)
RBC: 3.27 10*6/uL — ABNORMAL LOW (ref 3.70–5.45)
RDW: 22 % — AB (ref 11.2–14.5)
WBC: 14.4 10*3/uL — AB (ref 3.9–10.3)

## 2016-11-15 MED ORDER — SULFAMETHOXAZOLE-TRIMETHOPRIM 800-160 MG PO TABS: 1.0000 | ORAL_TABLET | Freq: Two times a day (BID) | ORAL | 0 refills | Status: DC

## 2016-11-15 NOTE — Telephone Encounter (Signed)
Call placed to patient's daughter Suanne Marker to notify her per L. Thomas NP to have pt come in this AM for u/a and culture.  Pt.'s daughter appreciative of call back and will bring pt in now for u/a.

## 2016-11-15 NOTE — Telephone Encounter (Signed)
Call placed to patient's daughter Suanne Marker to inform her that prescription for antibiotic has been sent in for UTI to [facility] on [address].  Patient's daughter appreciative of return call and has no questions at this time.

## 2016-11-15 NOTE — Telephone Encounter (Signed)
Message received from patients daughter stating that pt is experiencing frequency and burning with urination and would like to know if antibiotic can be called in or if she should come in for urinalysis.  L. Thomas NP notified and order received for pt to come in for u/a and culture this AM.

## 2016-11-17 LAB — URINE CULTURE

## 2016-11-18 ENCOUNTER — Other Ambulatory Visit: Payer: Self-pay | Admitting: Oncology

## 2016-11-19 ENCOUNTER — Telehealth: Payer: Self-pay

## 2016-11-19 NOTE — Telephone Encounter (Signed)
Called and informed pt urine culture confirmed infection and to continue abx until completion, pt verbalized understanding and denies any questions or concerns at this time

## 2016-11-19 NOTE — Telephone Encounter (Signed)
-----   Message from [name], NP sent at 11/19/2016  9:05 AM EST ----- Please let Crystal Small know the urine culture confirmed an infection. She should continue the antibiotic to completion.

## 2016-11-21 ENCOUNTER — Ambulatory Visit (HOSPITAL_BASED_OUTPATIENT_CLINIC_OR_DEPARTMENT_OTHER): Payer: Medicare Other

## 2016-11-21 ENCOUNTER — Other Ambulatory Visit (HOSPITAL_BASED_OUTPATIENT_CLINIC_OR_DEPARTMENT_OTHER): Payer: Medicare Other

## 2016-11-21 ENCOUNTER — Other Ambulatory Visit (HOSPITAL_COMMUNITY)
Admission: RE | Admit: 2016-11-21 | Discharge: 2016-11-21 | Disposition: A | Discharge status: Home / Self Care | Payer: Medicare Other | Source: Ambulatory Visit | Attending: Nurse Practitioner | Admitting: Nurse Practitioner

## 2016-11-21 ENCOUNTER — Ambulatory Visit (HOSPITAL_BASED_OUTPATIENT_CLINIC_OR_DEPARTMENT_OTHER): Payer: Medicare Other | Admitting: Nurse Practitioner

## 2016-11-21 ENCOUNTER — Encounter: Payer: Self-pay | Admitting: Nurse Practitioner

## 2016-11-21 VITALS — BP 102/84 | HR 77 | Temp 98.6°F | Resp 17 | Ht 65.0 in | Wt 97.8 lb

## 2016-11-21 DIAGNOSIS — Z95828 Presence of other vascular implants and grafts: Secondary | ICD-10-CM

## 2016-11-21 DIAGNOSIS — C251 Malignant neoplasm of body of pancreas: Secondary | ICD-10-CM

## 2016-11-21 DIAGNOSIS — Z5111 Encounter for antineoplastic chemotherapy: Secondary | ICD-10-CM

## 2016-11-21 DIAGNOSIS — M545 Low back pain, unspecified: Secondary | ICD-10-CM

## 2016-11-21 DIAGNOSIS — G8929 Other chronic pain: Secondary | ICD-10-CM | POA: Diagnosis not present

## 2016-11-21 LAB — CBC WITH DIFFERENTIAL/PLATELET
BASO%: 1 % (ref 0.0–2.0)
BASOS ABS: 0.1 10*3/uL (ref 0.0–0.1)
EOS ABS: 0.1 10*3/uL (ref 0.0–0.5)
EOS%: 1.3 % (ref 0.0–7.0)
HEMATOCRIT: 30.3 % — AB (ref 34.8–46.6)
HGB: 10.1 g/dL — ABNORMAL LOW (ref 11.6–15.9)
LYMPH#: 1.5 10*3/uL (ref 0.9–3.3)
LYMPH%: 14.6 % (ref 14.0–49.7)
MCH: 34.5 pg — AB (ref 25.1–34.0)
MCHC: 33.4 g/dL (ref 31.5–36.0)
MCV: 103.3 fL — AB (ref 79.5–101.0)
MONO#: 1.5 10*3/uL — AB (ref 0.1–0.9)
MONO%: 14.6 % — ABNORMAL HIGH (ref 0.0–14.0)
NEUT#: 7 10*3/uL — ABNORMAL HIGH (ref 1.5–6.5)
NEUT%: 68.5 % (ref 38.4–76.8)
PLATELETS: 156 10*3/uL (ref 145–400)
RBC: 2.94 10*6/uL — AB (ref 3.70–5.45)
RDW: 20.8 % — ABNORMAL HIGH (ref 11.2–14.5)
WBC: 10.2 10*3/uL (ref 3.9–10.3)

## 2016-11-21 LAB — BASIC METABOLIC PANEL
ANION GAP: 7 meq/L (ref 3–11)
BUN: 9.6 mg/dL (ref 7.0–26.0)
CO2: 22 meq/L (ref 22–29)
Calcium: 8.6 mg/dL (ref 8.4–10.4)
Chloride: 105 mEq/L (ref 98–109)
Creatinine: 0.7 mg/dL (ref 0.6–1.1)
EGFR: 84 mL/min/{1.73_m2} — AB (ref >90)
GLUCOSE: 106 mg/dL (ref 70–140)
POTASSIUM: 4.1 meq/L (ref 3.5–5.1)
Sodium: 134 mEq/L — ABNORMAL LOW (ref 136–145)

## 2016-11-21 LAB — HEPATIC FUNCTION PANEL
ALT: 18 U/L (ref 14–54)
AST: 27 U/L (ref 15–41)
Albumin: 3.2 g/dL — ABNORMAL LOW (ref 3.5–5.0)
Alkaline Phosphatase: 102 U/L (ref 38–126)
BILIRUBIN TOTAL: 0.6 mg/dL (ref 0.3–1.2)
Total Protein: 6.2 g/dL — ABNORMAL LOW (ref 6.5–8.1)

## 2016-11-21 MED ORDER — SODIUM CHLORIDE 0.9% FLUSH
10.0000 mL | INTRAVENOUS | Status: DC | PRN
  Filled 2016-11-21: qty 10

## 2016-11-21 MED ORDER — ALTEPLASE 2 MG IJ SOLR
2.0000 mg | Freq: Once | INTRAMUSCULAR | Status: DC | PRN
  Filled 2016-11-21: qty 2

## 2016-11-21 MED ORDER — SODIUM CHLORIDE 0.9 % IV SOLN: Freq: Once | INTRAVENOUS | Status: DC

## 2016-11-21 MED ORDER — SODIUM CHLORIDE 0.9 % IJ SOLN
10.0000 mL | INTRAMUSCULAR | Status: DC | PRN
  Filled 2016-11-21: qty 10

## 2016-11-21 MED ORDER — DEXAMETHASONE SODIUM PHOSPHATE 10 MG/ML IJ SOLN
5.0000 mg | Freq: Once | INTRAMUSCULAR | Status: AC
  Administered 2016-11-21: 5 mg via INTRAVENOUS

## 2016-11-21 MED ORDER — PALONOSETRON HCL INJECTION 0.25 MG/5ML
0.2500 mg | Freq: Once | INTRAVENOUS | Status: AC
  Administered 2016-11-21: 0.25 mg via INTRAVENOUS

## 2016-11-21 MED ORDER — LEUCOVORIN CALCIUM INJECTION 350 MG
400.0000 mg/m2 | Freq: Once | INTRAMUSCULAR | Status: AC
  Administered 2016-11-21: 596 mg via INTRAVENOUS
  Filled 2016-11-21: qty 29.8

## 2016-11-21 MED ORDER — HEPARIN SOD (PORK) LOCK FLUSH 100 UNIT/ML IV SOLN
250.0000 [IU] | Freq: Once | INTRAVENOUS | Status: DC | PRN
  Filled 2016-11-21: qty 5

## 2016-11-21 MED ORDER — OXALIPLATIN CHEMO INJECTION 100 MG/20ML
85.0000 mg/m2 | Freq: Once | INTRAVENOUS | Status: AC
  Administered 2016-11-21: 125 mg via INTRAVENOUS
  Filled 2016-11-21: qty 20

## 2016-11-21 MED ORDER — FLUOROURACIL CHEMO INJECTION 2.5 GM/50ML
400.0000 mg/m2 | Freq: Once | INTRAVENOUS | Status: AC
  Administered 2016-11-21: 600 mg via INTRAVENOUS
  Filled 2016-11-21: qty 12

## 2016-11-21 MED ORDER — HEPARIN SOD (PORK) LOCK FLUSH 100 UNIT/ML IV SOLN
500.0000 [IU] | Freq: Once | INTRAVENOUS | Status: DC | PRN
  Filled 2016-11-21: qty 5

## 2016-11-21 MED ORDER — SODIUM CHLORIDE 0.9% FLUSH
3.0000 mL | Freq: Once | INTRAVENOUS | Status: AC | PRN
  Administered 2016-11-21: 3 mL via INTRAVENOUS
  Filled 2016-11-21: qty 10

## 2016-11-21 MED ORDER — HEPARIN SOD (PORK) LOCK FLUSH 100 UNIT/ML IV SOLN
500.0000 [IU] | Freq: Once | INTRAVENOUS | Status: DC | PRN
Start: 2016-11-21 — End: 2016-11-21
  Filled 2016-11-21: qty 5

## 2016-11-21 MED ORDER — FLUOROURACIL CHEMO INJECTION 5 GM/100ML
2400.0000 mg/m2 | INTRAVENOUS | Status: DC
  Administered 2016-11-21: 3600 mg via INTRAVENOUS
  Filled 2016-11-21: qty 72

## 2016-11-21 MED ORDER — DEXTROSE 5 % IV SOLN
Freq: Once | INTRAVENOUS | Status: AC
  Administered 2016-11-21: 13:00:00 via INTRAVENOUS

## 2016-11-21 MED ORDER — PALONOSETRON HCL INJECTION 0.25 MG/5ML
INTRAVENOUS | Status: AC
  Filled 2016-11-21: qty 5

## 2016-11-21 MED ORDER — DEXAMETHASONE SODIUM PHOSPHATE 10 MG/ML IJ SOLN
INTRAMUSCULAR | Status: AC
  Filled 2016-11-21: qty 1

## 2016-11-21 NOTE — Progress Notes (Signed)
HPI:  Crystal Small 81 y.o. female diagnosed with pancreatic cancer.  Currently undergoing FOLFOX/Zometa chemotherapy therapy regimen.    No history exists.    Review of Systems  Musculoskeletal: Positive for back pain.  All other systems reviewed and are negative.   Past Medical History:  Diagnosis Date  . Cancer [facility])    pancreatic cancer   . GERD (gastroesophageal reflux disease)   . Hemorrhoids   . Hyperlipidemia   . Pancreatic mass     Past Surgical History:  Procedure Laterality Date  . ABDOMINAL HYSTERECTOMY  1990   COMPLETE  . COLONOSCOPY  2006  . EUS N/A 06/27/2016   Procedure: ESOPHAGEAL ENDOSCOPIC ULTRASOUND (EUS) RADIAL;  Surgeon: Milus Banister, MD;  Location: [facility] ENDOSCOPY;  Service: Endoscopy;  Laterality: N/A;  . IR GENERIC HISTORICAL  07/10/2016   IR US GUIDE VASC ACCESS RIGHT 07/10/2016 Jacqulynn Cadet, MD [facility]-INTERV RAD  . IR GENERIC HISTORICAL  07/10/2016   IR FLUORO GUIDE CV LINE RIGHT 07/10/2016 Jacqulynn Cadet, MD [facility]-INTERV RAD  . LIVER BIOPSY  06/19/2016   WITH ULTRASOUND  . PORTACATH PLACEMENT      has Cancer of pancreas, body ([place]); Port catheter in place; Weakness generalized; Protein-calorie malnutrition, severe; and Low back pain on her problem list.    has No Known Allergies.  Allergies as of 11/21/2016   No Known Allergies     Medication List       Accurate as of 11/21/16  5:35 PM. Always use your most recent med list.          acetaminophen 325 MG tablet Commonly known as:  TYLENOL Take 325 mg by mouth every 6 (six) hours as needed for fever.   ALPRAZolam 0.25 MG tablet Commonly known as:  XANAX Take 1 tablet (0.25 mg total) by mouth at bedtime as needed for sleep.   HYDROcodone-acetaminophen 5-325 MG tablet Commonly known as:  NORCO/VICODIN Take 1 tablet by mouth every 4 (four) hours as needed for moderate pain.   lidocaine-prilocaine cream Commonly known as:  EMLA Apply 1 application topically as needed (prior to  accessing port).   nystatin 100000 UNIT/ML suspension Commonly known as:  MYCOSTATIN Take 5 mLs (500,000 Units total) by mouth 4 (four) times daily.   omeprazole 40 MG capsule Commonly known as:  PRILOSEC Take 40 mg by mouth daily.   ondansetron 8 MG tablet Commonly known as:  ZOFRAN Take 1 tablet (8 mg total) by mouth every 8 (eight) hours as needed for nausea or vomiting.   potassium chloride 20 MEQ/15ML (10%) Soln TAKE 15 ML(1 TABLESPOONFUL) BY MOUTH DAILY   prochlorperazine 5 MG tablet Commonly known as:  COMPAZINE Take 1 tablet (5 mg total) by mouth every 6 (six) hours as needed for nausea or vomiting.        PHYSICAL EXAMINATION  Oncology Vitals 11/21/2016 11/02/2016  Height 165 cm -  Weight 44.362 kg -  Weight (lbs) 97 lbs 13 oz -  BMI (kg/m2) 16.27 kg/m2 -  Temp 98.6 98.8  Pulse 77 69  Resp 17 18  SpO2 100 100  BSA (m2) 1.43 m2 -   BP Readings from Last 2 Encounters:  11/21/16 102/84  11/02/16 (!) 125/43    Physical Exam  Constitutional: She is oriented to person, place, and time and well-developed, well-nourished, and in no distress.  HENT:  Head: Normocephalic and atraumatic.  Eyes: Conjunctivae and EOM are normal. Pupils are equal, round, and reactive to light.  Neck: Normal  range of motion.  Pulmonary/Chest: Effort normal. No respiratory distress.  Musculoskeletal: Normal range of motion.  Neurological: She is alert and oriented to person, place, and time. Gait normal.  Skin: Skin is warm and dry.  Psychiatric: Affect normal.  Nursing note and vitals reviewed.   LABORATORY DATA:. Hospital Outpatient Visit on 11/21/2016  Component Date Value Ref Range Status  . Total Protein 11/21/2016 6.2* 6.5 - 8.1 g/dL Final  . Albumin 11/21/2016 3.2* 3.5 - 5.0 g/dL Final  . AST 11/21/2016 27  15 - 41 U/L Final  . ALT 11/21/2016 18  14 - 54 U/L Final  . Alkaline Phosphatase 11/21/2016 102  38 - 126 U/L Final  . Total Bilirubin 11/21/2016 0.6  0.3 - 1.2  mg/dL Final  . Bilirubin, Direct 11/21/2016 <0.1* 0.1 - 0.5 mg/dL Final  . Indirect Bilirubin 11/21/2016 NOT CALCULATED  0.3 - 0.9 mg/dL Final  Appointment on 11/21/2016  Component Date Value Ref Range Status  . Sodium 11/21/2016 134* 136 - 145 mEq/L Final  . Potassium 11/21/2016 4.1  3.5 - 5.1 mEq/L Final  . Chloride 11/21/2016 105  98 - 109 mEq/L Final  . CO2 11/21/2016 22  22 - 29 mEq/L Final  . Glucose 11/21/2016 106  70 - 140 mg/dl Final  . BUN 11/21/2016 9.6  7.0 - 26.0 mg/dL Final  . Creatinine 11/21/2016 0.7  0.6 - 1.1 mg/dL Final  . Calcium 11/21/2016 8.6  8.4 - 10.4 mg/dL Final  . Anion Gap 11/21/2016 7  3 - 11 mEq/L Final  . EGFR 11/21/2016 84* >90 ml/min/1.73 m2 Final  . WBC 11/21/2016 10.2  3.9 - 10.3 10e3/uL Final  . NEUT# 11/21/2016 7.0* 1.5 - 6.5 10e3/uL Final  . HGB 11/21/2016 10.1* 11.6 - 15.9 g/dL Final  . HCT 11/21/2016 30.3* 34.8 - 46.6 % Final  . Platelets 11/21/2016 156  145 - 400 10e3/uL Final  . MCV 11/21/2016 103.3* 79.5 - 101.0 fL Final  . MCH 11/21/2016 34.5* 25.1 - 34.0 pg Final  . MCHC 11/21/2016 33.4  31.5 - 36.0 g/dL Final  . RBC 11/21/2016 2.94* 3.70 - 5.45 10e6/uL Final  . RDW 11/21/2016 20.8* 11.2 - 14.5 % Final  . lymph# 11/21/2016 1.5  0.9 - 3.3 10e3/uL Final  . MONO# 11/21/2016 1.5* 0.1 - 0.9 10e3/uL Final  . Eosinophils Absolute 11/21/2016 0.1  0.0 - 0.5 10e3/uL Final  . Basophils Absolute 11/21/2016 0.1  0.0 - 0.1 10e3/uL Final  . NEUT% 11/21/2016 68.5  38.4 - 76.8 % Final  . LYMPH% 11/21/2016 14.6  14.0 - 49.7 % Final  . MONO% 11/21/2016 14.6* 0.0 - 14.0 % Final  . EOS% 11/21/2016 1.3  0.0 - 7.0 % Final  . BASO% 11/21/2016 1.0  0.0 - 2.0 % Final    RADIOGRAPHIC STUDIES: No results found.  ASSESSMENT/PLAN:    Low back pain Patient has history of chronic low back pain.  She states that she lost her balance a few weeks ago; and fell onto the wall.  She denies any loss of consciousness with this fall.  She states that her back pain  slightly increased with this fall.  She states that the pain does not radiate.  She also denies any numbness or tingling; and also denies any increased weakness to any extremity.  Patient also denies any urine or stool incontinence.  Patient states that she already has a an orthopedist who has injected her back with steroids in the past for chronic back pain.  Advised patient she may want to follow back up with orthopedist regarding her chronic back pain issues.  Also advised patient and her family member that she would need to clear any steroid injections with Dr. Benay Spice prior to undergoing any further procedures regarding her back.  Cancer of pancreas, body [facility]) Patient presents to the [facility] today to receive cycle 4 of her FOLFOX/Zometa chemotherapy regimen.  Patient states she's been doing fairly well recently; with her only complaint of chronic low back pain.  See further notes for details.  Labs obtained today were all essentially within normal limits.  Patient will proceed with her chemotherapy and Zometa today as planned.  She will return on 11/23/2016 for pump discontinuation.  She will return on 12/12/2016 for labs, flush, visit, and chemotherapy again.   Patient stated understanding of all instructions; and was in agreement with this plan of care. The patient knows to call the clinic with any problems, questions or concerns.   Total time spent with patient was 25 minutes;  with greater than 75 percent of that time spent in face to face counseling regarding patient's symptoms,  and coordination of care and follow up.  Disclaimer:This dictation was prepared with Dragon/digital dictation along with [employer]. Any transcriptional errors that result from this process are unintentional.  Drue Second, NP 11/21/2016

## 2016-11-21 NOTE — Patient Instructions (Signed)
[facility] Discharge Instructions for Patients Receiving Chemotherapy  Today you received the following chemotherapy agents:  Oxaliplatin, Leucovorin, Fluorouracil  To help prevent nausea and vomiting after your treatment, we encourage you to take your nausea medication as prescribed.   If you develop nausea and vomiting that is not controlled by your nausea medication, call the clinic.   BELOW ARE SYMPTOMS THAT SHOULD BE REPORTED IMMEDIATELY:  *FEVER GREATER THAN 100.5 F  *CHILLS WITH OR WITHOUT FEVER  NAUSEA AND VOMITING THAT IS NOT CONTROLLED WITH YOUR NAUSEA MEDICATION  *UNUSUAL SHORTNESS OF BREATH  *UNUSUAL BRUISING OR BLEEDING  TENDERNESS IN MOUTH AND THROAT WITH OR WITHOUT PRESENCE OF ULCERS  *URINARY PROBLEMS  *BOWEL PROBLEMS  UNUSUAL RASH Items with * indicate a potential emergency and should be followed up as soon as possible.  Feel free to call the clinic you have any questions or concerns. The clinic phone number is [phone].  Please show the CHEMO ALERT CARD at check-in to the Emergency Department and triage nurse.   

## 2016-11-21 NOTE — Assessment & Plan Note (Signed)
Patient presents to the [facility] today to receive cycle 4 of her FOLFOX/Zometa chemotherapy regimen.  Patient states she's been doing fairly well recently; with her only complaint of chronic low back pain.  See further notes for details.  Labs obtained today were all essentially within normal limits.  Patient will proceed with her chemotherapy and Zometa today as planned.  She will return on 11/23/2016 for pump discontinuation.  She will return on 12/12/2016 for labs, flush, visit, and chemotherapy again.

## 2016-11-21 NOTE — Patient Instructions (Signed)

## 2016-11-21 NOTE — Assessment & Plan Note (Signed)
Patient has history of chronic low back pain.  She states that she lost her balance a few weeks ago; and fell onto the wall.  She denies any loss of consciousness with this fall.  She states that her back pain slightly increased with this fall.  She states that the pain does not radiate.  She also denies any numbness or tingling; and also denies any increased weakness to any extremity.  Patient also denies any urine or stool incontinence.  Patient states that she already has a an orthopedist who has injected her back with steroids in the past for chronic back pain.  Advised patient she may want to follow back up with orthopedist regarding her chronic back pain issues.  Also advised patient and her family member that she would need to clear any steroid injections with Dr. Benay Spice prior to undergoing any further procedures regarding her back.

## 2016-11-23 ENCOUNTER — Ambulatory Visit (HOSPITAL_BASED_OUTPATIENT_CLINIC_OR_DEPARTMENT_OTHER): Payer: Medicare Other

## 2016-11-23 VITALS — BP 119/45 | HR 88 | Temp 98.7°F | Resp 17

## 2016-11-23 DIAGNOSIS — C251 Malignant neoplasm of body of pancreas: Secondary | ICD-10-CM

## 2016-11-23 MED ORDER — SODIUM CHLORIDE 0.9% FLUSH
10.0000 mL | INTRAVENOUS | Status: DC | PRN
  Administered 2016-11-23: 10 mL
  Filled 2016-11-23: qty 10

## 2016-11-23 MED ORDER — PEGFILGRASTIM INJECTION 6 MG/0.6ML ~~LOC~~
6.0000 mg | PREFILLED_SYRINGE | Freq: Once | SUBCUTANEOUS | Status: AC
Start: 2016-11-23 — End: 2016-11-23
  Administered 2016-11-23: 6 mg via SUBCUTANEOUS

## 2016-11-23 MED ORDER — HEPARIN SOD (PORK) LOCK FLUSH 100 UNIT/ML IV SOLN
500.0000 [IU] | Freq: Once | INTRAVENOUS | Status: AC | PRN
  Administered 2016-11-23: 500 [IU]
  Filled 2016-11-23: qty 5

## 2016-11-23 NOTE — Patient Instructions (Signed)

## 2016-11-26 ENCOUNTER — Telehealth: Payer: Self-pay | Admitting: *Deleted

## 2016-11-26 DIAGNOSIS — C251 Malignant neoplasm of body of pancreas: Secondary | ICD-10-CM

## 2016-11-26 MED ORDER — HYDROCODONE-ACETAMINOPHEN 5-325 MG PO TABS: 1.0000 | ORAL_TABLET | ORAL | 0 refills | Status: AC | PRN

## 2016-11-26 NOTE — Addendum Note (Signed)
Addended by: Brien Few on: 11/26/2016 01:27 PM   Modules accepted: Orders

## 2016-11-26 NOTE — Telephone Encounter (Signed)
Daughter states pt needs refill on Hydrocodone 5/325 mg.  She asks to be notified at [phone] when Rx is ready to pick up.

## 2016-12-08 ENCOUNTER — Other Ambulatory Visit: Payer: Self-pay | Admitting: Oncology

## 2016-12-08 ENCOUNTER — Other Ambulatory Visit: Payer: Self-pay | Admitting: Nurse Practitioner

## 2016-12-08 DIAGNOSIS — C251 Malignant neoplasm of body of pancreas: Secondary | ICD-10-CM

## 2016-12-10 ENCOUNTER — Other Ambulatory Visit: Payer: Self-pay | Admitting: Nurse Practitioner

## 2016-12-10 DIAGNOSIS — C251 Malignant neoplasm of body of pancreas: Secondary | ICD-10-CM

## 2016-12-12 ENCOUNTER — Telehealth: Payer: Self-pay | Admitting: Oncology

## 2016-12-12 ENCOUNTER — Telehealth: Payer: Self-pay | Admitting: *Deleted

## 2016-12-12 ENCOUNTER — Other Ambulatory Visit: Payer: Medicare Other

## 2016-12-12 ENCOUNTER — Ambulatory Visit: Payer: Medicare Other

## 2016-12-12 ENCOUNTER — Ambulatory Visit: Payer: Medicare Other | Admitting: Oncology

## 2016-12-12 DIAGNOSIS — M545 Low back pain, unspecified: Secondary | ICD-10-CM

## 2016-12-12 DIAGNOSIS — G8929 Other chronic pain: Secondary | ICD-10-CM

## 2016-12-12 DIAGNOSIS — C251 Malignant neoplasm of body of pancreas: Secondary | ICD-10-CM

## 2016-12-12 NOTE — Telephone Encounter (Signed)
Returned call to [place], lab appt given for 1/19 @ 0930. She thinks the 1/24 office and chemo appointment will be OK- they may move the closing to another day.

## 2016-12-12 NOTE — Telephone Encounter (Signed)
Spoke with patient re 1/24 appointments. Rescheduled due to weather per 1/18 schedule message.

## 2016-12-12 NOTE — Telephone Encounter (Signed)
"  Could my mom come in tomorrow to have her urine checked?  I was hoping she would come in today or tomorrow to have her urine checked but appointment is 12-18-2016.  She has some confusion and has this when she has a urinary tract infection.  She hurts across her back where kidneys are.  no change in the color of her urine, no fever, doesn't feel any pain or burning with urination.  Denies change in urine color or odor.  I need to talk with scheduling because the 12-18-2016 appointments are too late.  We close on our house  This day and she will not want to sit through the signing."  Call transferred to collaborative for provider involvement.  MD appointment needed before chemotherapy appointment.

## 2016-12-12 NOTE — Telephone Encounter (Signed)
"  This is Suanne Marker calling for my mom we can't get out today so she will need a new appointment scheduled.  Call me at [phone]." Schedule message sent

## 2016-12-13 ENCOUNTER — Ambulatory Visit (HOSPITAL_BASED_OUTPATIENT_CLINIC_OR_DEPARTMENT_OTHER): Payer: Medicare Other

## 2016-12-13 DIAGNOSIS — C251 Malignant neoplasm of body of pancreas: Secondary | ICD-10-CM | POA: Diagnosis not present

## 2016-12-13 DIAGNOSIS — M545 Low back pain, unspecified: Secondary | ICD-10-CM

## 2016-12-13 DIAGNOSIS — G8929 Other chronic pain: Secondary | ICD-10-CM

## 2016-12-13 LAB — CBC WITH DIFFERENTIAL/PLATELET
BASO%: 0.3 % (ref 0.0–2.0)
BASOS ABS: 0 10*3/uL (ref 0.0–0.1)
EOS%: 0.4 % (ref 0.0–7.0)
Eosinophils Absolute: 0.1 10*3/uL (ref 0.0–0.5)
HEMATOCRIT: 30.5 % — AB (ref 34.8–46.6)
HGB: 10.1 g/dL — ABNORMAL LOW (ref 11.6–15.9)
LYMPH%: 11.4 % — AB (ref 14.0–49.7)
MCH: 34.2 pg — AB (ref 25.1–34.0)
MCHC: 33.1 g/dL (ref 31.5–36.0)
MCV: 103.4 fL — AB (ref 79.5–101.0)
MONO#: 1.6 10*3/uL — AB (ref 0.1–0.9)
MONO%: 11.4 % (ref 0.0–14.0)
NEUT#: 10.8 10*3/uL — ABNORMAL HIGH (ref 1.5–6.5)
NEUT%: 76.5 % (ref 38.4–76.8)
PLATELETS: 94 10*3/uL — AB (ref 145–400)
RBC: 2.95 10*6/uL — AB (ref 3.70–5.45)
RDW: 16.2 % — ABNORMAL HIGH (ref 11.2–14.5)
WBC: 14.1 10*3/uL — ABNORMAL HIGH (ref 3.9–10.3)
lymph#: 1.6 10*3/uL (ref 0.9–3.3)

## 2016-12-13 LAB — COMPREHENSIVE METABOLIC PANEL
ALT: 16 U/L (ref 0–55)
ANION GAP: 7 meq/L (ref 3–11)
AST: 18 U/L (ref 5–34)
Albumin: 3 g/dL — ABNORMAL LOW (ref 3.5–5.0)
Alkaline Phosphatase: 115 U/L (ref 40–150)
BUN: 10.7 mg/dL (ref 7.0–26.0)
CALCIUM: 9.4 mg/dL (ref 8.4–10.4)
CO2: 25 meq/L (ref 22–29)
Chloride: 100 mEq/L (ref 98–109)
Creatinine: 0.7 mg/dL (ref 0.6–1.1)
EGFR: 84 mL/min/{1.73_m2} — AB (ref >90)
Glucose: 124 mg/dl (ref 70–140)
POTASSIUM: 4.1 meq/L (ref 3.5–5.1)
Sodium: 131 mEq/L — ABNORMAL LOW (ref 136–145)
Total Bilirubin: 0.8 mg/dL (ref 0.20–1.20)
Total Protein: 6.4 g/dL (ref 6.4–8.3)

## 2016-12-13 LAB — URINALYSIS, MICROSCOPIC - CHCC
BILIRUBIN (URINE): NEGATIVE
GLUCOSE UR CHCC: NEGATIVE mg/dL
Ketones: NEGATIVE mg/dL
Leukocyte Esterase: NEGATIVE
NITRITE: NEGATIVE
PH: 7 (ref 4.6–8.0)
PROTEIN: 30 mg/dL
Specific Gravity, Urine: 1.01 (ref 1.003–1.035)
Urobilinogen, UR: 0.2 mg/dL (ref 0.2–1)

## 2016-12-13 NOTE — Telephone Encounter (Signed)
UA result reviewed by Dr. Benay Spice: Call office for fever or dysuria. Do not recommend antibiotic at this time. Called pt's daughter with above. She voiced understanding.

## 2016-12-13 NOTE — Addendum Note (Signed)
Addended by: Brien Few on: 12/13/2016 04:44 PM   Modules accepted: Orders

## 2016-12-17 ENCOUNTER — Other Ambulatory Visit: Payer: Self-pay | Admitting: *Deleted

## 2016-12-18 ENCOUNTER — Other Ambulatory Visit (HOSPITAL_BASED_OUTPATIENT_CLINIC_OR_DEPARTMENT_OTHER): Payer: Medicare Other

## 2016-12-18 ENCOUNTER — Ambulatory Visit: Payer: Medicare Other

## 2016-12-18 ENCOUNTER — Ambulatory Visit (HOSPITAL_BASED_OUTPATIENT_CLINIC_OR_DEPARTMENT_OTHER): Payer: Medicare Other | Admitting: Oncology

## 2016-12-18 ENCOUNTER — Other Ambulatory Visit: Payer: Self-pay | Admitting: *Deleted

## 2016-12-18 VITALS — BP 113/45 | HR 81 | Temp 99.0°F | Resp 18 | Ht 65.0 in | Wt 97.6 lb

## 2016-12-18 DIAGNOSIS — C251 Malignant neoplasm of body of pancreas: Secondary | ICD-10-CM | POA: Diagnosis not present

## 2016-12-18 DIAGNOSIS — B370 Candidal stomatitis: Secondary | ICD-10-CM

## 2016-12-18 DIAGNOSIS — R509 Fever, unspecified: Secondary | ICD-10-CM

## 2016-12-18 DIAGNOSIS — C787 Secondary malignant neoplasm of liver and intrahepatic bile duct: Secondary | ICD-10-CM

## 2016-12-18 DIAGNOSIS — Z95828 Presence of other vascular implants and grafts: Secondary | ICD-10-CM

## 2016-12-18 DIAGNOSIS — G620 Drug-induced polyneuropathy: Secondary | ICD-10-CM

## 2016-12-18 LAB — CBC WITH DIFFERENTIAL/PLATELET
BASO%: 0.2 % (ref 0.0–2.0)
BASOS ABS: 0 10*3/uL (ref 0.0–0.1)
EOS%: 1.7 % (ref 0.0–7.0)
Eosinophils Absolute: 0.3 10*3/uL (ref 0.0–0.5)
HEMATOCRIT: 30.6 % — AB (ref 34.8–46.6)
HEMOGLOBIN: 10.3 g/dL — AB (ref 11.6–15.9)
LYMPH%: 7.9 % — ABNORMAL LOW (ref 14.0–49.7)
MCH: 33.9 pg (ref 25.1–34.0)
MCHC: 33.7 g/dL (ref 31.5–36.0)
MCV: 100.7 fL (ref 79.5–101.0)
MONO#: 1.7 10*3/uL — ABNORMAL HIGH (ref 0.1–0.9)
MONO%: 10.7 % (ref 0.0–14.0)
NEUT#: 12.9 10*3/uL — ABNORMAL HIGH (ref 1.5–6.5)
NEUT%: 79.5 % — ABNORMAL HIGH (ref 38.4–76.8)
Platelets: 145 10*3/uL (ref 145–400)
RBC: 3.04 10*6/uL — ABNORMAL LOW (ref 3.70–5.45)
RDW: 15.6 % — AB (ref 11.2–14.5)
WBC: 16.2 10*3/uL — AB (ref 3.9–10.3)
lymph#: 1.3 10*3/uL (ref 0.9–3.3)

## 2016-12-18 LAB — URINALYSIS, MICROSCOPIC - CHCC
Bilirubin (Urine): NEGATIVE
GLUCOSE UR CHCC: NEGATIVE mg/dL
KETONES: NEGATIVE mg/dL
Nitrite: NEGATIVE
PH: 6 (ref 4.6–8.0)
PROTEIN: 30 mg/dL
SPECIFIC GRAVITY, URINE: 1.02 (ref 1.003–1.035)
Urobilinogen, UR: 0.2 mg/dL (ref 0.2–1)

## 2016-12-18 LAB — COMPREHENSIVE METABOLIC PANEL
ALBUMIN: 2.9 g/dL — AB (ref 3.5–5.0)
ALK PHOS: 117 U/L (ref 40–150)
ALT: 26 U/L (ref 0–55)
ANION GAP: 9 meq/L (ref 3–11)
AST: 27 U/L (ref 5–34)
BUN: 11.9 mg/dL (ref 7.0–26.0)
CALCIUM: 10.3 mg/dL (ref 8.4–10.4)
CO2: 24 mEq/L (ref 22–29)
CREATININE: 0.6 mg/dL (ref 0.6–1.1)
Chloride: 99 mEq/L (ref 98–109)
EGFR: 88 mL/min/{1.73_m2} — ABNORMAL LOW (ref >90)
Glucose: 105 mg/dl (ref 70–140)
POTASSIUM: 4 meq/L (ref 3.5–5.1)
Sodium: 132 mEq/L — ABNORMAL LOW (ref 136–145)
Total Bilirubin: 0.81 mg/dL (ref 0.20–1.20)
Total Protein: 6.4 g/dL (ref 6.4–8.3)

## 2016-12-18 MED ORDER — ACETAMINOPHEN 325 MG PO TABS
ORAL_TABLET | ORAL | Status: AC
  Filled 2016-12-18: qty 2

## 2016-12-18 MED ORDER — ACETAMINOPHEN 325 MG PO TABS
650.0000 mg | ORAL_TABLET | Freq: Once | ORAL | Status: AC
  Administered 2016-12-18: 650 mg via ORAL

## 2016-12-18 MED ORDER — FLUCONAZOLE 100 MG PO TABS: 100.0000 mg | ORAL_TABLET | Freq: Every day | ORAL | 0 refills | Status: AC

## 2016-12-18 MED ORDER — HEPARIN SOD (PORK) LOCK FLUSH 100 UNIT/ML IV SOLN
500.0000 [IU] | Freq: Once | INTRAVENOUS | Status: AC | PRN
  Administered 2016-12-18: 500 [IU] via INTRAVENOUS
  Filled 2016-12-18: qty 5

## 2016-12-18 NOTE — Addendum Note (Signed)
Addended by: Brien Few on: 12/18/2016 01:33 PM   Modules accepted: Orders

## 2016-12-18 NOTE — Progress Notes (Signed)
  [facility] OFFICE PROGRESS NOTE   Diagnosis: Pancreas cancer  INTERVAL HISTORY:   Crystal Small returns as scheduled. She completed another cycle of FOLFOX 11/21/2017. No nausea, vomiting, or diarrhea following chemotherapy. She reports tingling in the hands for 5 days following chemotherapy. No neuropathy symptoms at present. She has intermittent low-grade fever. She complains of low back pain on the right greater than left since falling at home. She takes hydrocodone for pain. No dysuria.  Objective:  Vital signs in last 24 hours:  Blood pressure (!) 113/45, pulse 81, temperature 99.5 F (37.5 C), temperature source Oral, resp. rate 18, height 5\' 5"  (1.651 m), weight 97 lb 9.6 oz (44.3 kg), SpO2 100 %.    HEENT: Thrush at the left greater than right buccal mucosa and tongue Resp: Lungs clear bilaterally Cardio: Regular rate and rhythm GI: Soft, no hepatosplenomegaly, nontender Vascular: No leg edema Neuro: Mild to moderate decrease in vibratory sense at the fingertips bilaterally    Portacath/PICC-without erythema  Lab Results:  Lab Results  Component Value Date   WBC 16.2 (H) 12/18/2016   HGB 10.3 (L) 12/18/2016   HCT 30.6 (L) 12/18/2016   MCV 100.7 12/18/2016   PLT 145 12/18/2016   NEUTROABS 12.9 (H) 12/18/2016    Medications: I have reviewed the patient's current medications.  Assessment/Plan: 1. Pancreas cancer, pancreas body/tail mass-FNA biopsy of a left liver lesion by EUS on 06/27/2016 confirmed poorly differential adenocarcinoma (features of adenocarcinoma with a degree of squamous differentiation)  CT abdomen/pelvis on 06/14/2016 revealed a pancreas body and tail mass with involvement of the superior mesenteric vein and splenic vein thrombosis, multiple liver metastases  Cycle 1 gemcitabine/Abraxane 07/11/2016  Cycle 2 gemcitabine/Abraxane 07/25/2016  Cycle 3 gemcitabine/Abraxane 08/08/2016  Cycle 4 gemcitabine/Abraxane 08/22/2016  Cycle  5 gemcitabine/Abraxane 10/12/2017Restaging CT scans abdomen/pelvis 09/12/2016 showed significant progression of liver metastases. Primary lesion involving the tail of the pancreas decreased in size.  Cycle 1 FOLFOX 09/26/2016  Cycle 2 FOLFOX 10/10/2016  Cycle 3 FOLFOX 10/31/2016 (scheduled adjusted to every 3 weeks per patient request)  Cycle 4 FOLFOX 11/21/2016  Cycle 5 FOLFOX 12/18/2016  2. Left abdominal pain secondary to #1. Improved  3. Anorexia/weight loss  4. Status post evaluation in the emergency department 07/13/2016 for fever and confusion. Urine culture showed enterococcus. She completed a course of amoxicillin.  5. Hypercalcemia, status post Zometa 09/26/2016  6. Hospitalization 09/28/2016 through 10/01/2015 presenting with weakness and fever. Diagnosed with UTI.  7. Low-grade fevers, question "tumor fever". Denies fever 10/10/2016.  8.   Early oxaliplatin neuropathy   Disposition:  Her overall status appears unchanged. She has completed 4 cycles of FOLFOX. She is scheduled for cycle 5 today. She has a fever today. This is most likely related to "tumor "fever. No apparent source for infection on review of her history and exam. We will check a urinalysis prior to chemotherapy today.  She will be scheduled for a restaging CT after this cycle of chemotherapy. Ms. Groseclose will return for an office visit in 2 weeks.  We will treat the thrush with Diflucan.  [name], MD  12/18/2016  12:14 PM

## 2016-12-18 NOTE — Progress Notes (Signed)
Urine culture added. Per Dr. Benay Spice: Hold chemo today, pt to follow up after scans. She and daughter voiced understanding. Instructed pt to push fluids, alternate Tylenol and Ibuprofen for fever. Call office if she develops any other signs of infection.

## 2016-12-18 NOTE — Addendum Note (Signed)
Addended by: Rosalio Macadamia C on: 12/18/2016 01:09 PM   Modules accepted: Orders

## 2016-12-18 NOTE — Addendum Note (Signed)
Addended by: Brien Few on: 12/18/2016 02:09 PM   Modules accepted: Orders

## 2016-12-19 LAB — URINE CULTURE

## 2016-12-23 ENCOUNTER — Encounter (HOSPITAL_COMMUNITY): Payer: Self-pay | Admitting: Emergency Medicine

## 2016-12-23 ENCOUNTER — Telehealth: Payer: Self-pay | Admitting: *Deleted

## 2016-12-23 ENCOUNTER — Emergency Department (HOSPITAL_COMMUNITY): Payer: Medicare Other

## 2016-12-23 ENCOUNTER — Emergency Department (HOSPITAL_COMMUNITY)
Admission: EM | Admit: 2016-12-23 | Discharge: 2016-12-23 | Disposition: A | Discharge status: Home / Self Care | Payer: Medicare Other | Attending: Emergency Medicine | Admitting: Emergency Medicine

## 2016-12-23 ENCOUNTER — Telehealth: Payer: Self-pay | Admitting: Oncology

## 2016-12-23 DIAGNOSIS — Z79899 Other long term (current) drug therapy: Secondary | ICD-10-CM | POA: Insufficient documentation

## 2016-12-23 DIAGNOSIS — C259 Malignant neoplasm of pancreas, unspecified: Secondary | ICD-10-CM | POA: Insufficient documentation

## 2016-12-23 DIAGNOSIS — M545 Low back pain, unspecified: Secondary | ICD-10-CM

## 2016-12-23 DIAGNOSIS — G8929 Other chronic pain: Secondary | ICD-10-CM | POA: Insufficient documentation

## 2016-12-23 LAB — COMPREHENSIVE METABOLIC PANEL
ALT: 33 U/L (ref 14–54)
AST: 24 U/L (ref 15–41)
Albumin: 3.1 g/dL — ABNORMAL LOW (ref 3.5–5.0)
Alkaline Phosphatase: 106 U/L (ref 38–126)
Anion gap: 9 (ref 5–15)
BILIRUBIN TOTAL: 1 mg/dL (ref 0.3–1.2)
BUN: 16 mg/dL (ref 6–20)
CALCIUM: 10 mg/dL (ref 8.9–10.3)
CO2: 28 mmol/L (ref 22–32)
CREATININE: 0.47 mg/dL (ref 0.44–1.00)
Chloride: 96 mmol/L — ABNORMAL LOW (ref 101–111)
GFR calc Af Amer: >60 mL/min (ref >60)
Glucose, Bld: 108 mg/dL — ABNORMAL HIGH (ref 65–99)
Potassium: 3.3 mmol/L — ABNORMAL LOW (ref 3.5–5.1)
Sodium: 133 mmol/L — ABNORMAL LOW (ref 135–145)
TOTAL PROTEIN: 6.2 g/dL — AB (ref 6.5–8.1)

## 2016-12-23 LAB — CBC WITH DIFFERENTIAL/PLATELET
BASOS ABS: 0 10*3/uL (ref 0.0–0.1)
BASOS PCT: 0 %
EOS PCT: 1 %
Eosinophils Absolute: 0.1 10*3/uL (ref 0.0–0.7)
HCT: 30.6 % — ABNORMAL LOW (ref 36.0–46.0)
Hemoglobin: 10.2 g/dL — ABNORMAL LOW (ref 12.0–15.0)
Lymphocytes Relative: 5 %
Lymphs Abs: 1.1 10*3/uL (ref 0.7–4.0)
MCH: 33 pg (ref 26.0–34.0)
MCHC: 33.3 g/dL (ref 30.0–36.0)
MCV: 99 fL (ref 78.0–100.0)
MONO ABS: 2.2 10*3/uL — AB (ref 0.1–1.0)
Monocytes Relative: 10 %
Neutro Abs: 19.1 10*3/uL — ABNORMAL HIGH (ref 1.7–7.7)
Neutrophils Relative %: 84 %
PLATELETS: 174 10*3/uL (ref 150–400)
RBC: 3.09 MIL/uL — ABNORMAL LOW (ref 3.87–5.11)
RDW: 15.4 % (ref 11.5–15.5)
WBC: 22.5 10*3/uL — ABNORMAL HIGH (ref 4.0–10.5)

## 2016-12-23 LAB — URINALYSIS, ROUTINE W REFLEX MICROSCOPIC
Bilirubin Urine: NEGATIVE
GLUCOSE, UA: NEGATIVE mg/dL
Hgb urine dipstick: NEGATIVE
KETONES UR: NEGATIVE mg/dL
LEUKOCYTES UA: NEGATIVE
NITRITE: NEGATIVE
PROTEIN: NEGATIVE mg/dL
Specific Gravity, Urine: 1.004 — ABNORMAL LOW (ref 1.005–1.030)
pH: 6 (ref 5.0–8.0)

## 2016-12-23 LAB — LIPASE, BLOOD: LIPASE: 13 U/L (ref 11–51)

## 2016-12-23 MED ORDER — IOPAMIDOL (ISOVUE-300) INJECTION 61%
INTRAVENOUS | Status: AC
  Filled 2016-12-23: qty 100

## 2016-12-23 MED ORDER — IOPAMIDOL (ISOVUE-300) INJECTION 61%
INTRAVENOUS | Status: AC
  Administered 2016-12-23: 30 mL via ORAL
  Filled 2016-12-23: qty 30

## 2016-12-23 MED ORDER — IOPAMIDOL (ISOVUE-300) INJECTION 61%
30.0000 mL | Freq: Once | INTRAVENOUS | Status: AC | PRN
  Administered 2016-12-23: 30 mL via ORAL

## 2016-12-23 MED ORDER — IOPAMIDOL (ISOVUE-300) INJECTION 61%: 100.0000 mL | Freq: Once | INTRAVENOUS | Status: DC | PRN

## 2016-12-23 MED ORDER — MORPHINE SULFATE (PF) 2 MG/ML IV SOLN
2.0000 mg | Freq: Once | INTRAVENOUS | Status: AC
  Administered 2016-12-23: 2 mg via INTRAVENOUS
  Filled 2016-12-23: qty 1

## 2016-12-23 MED ORDER — HEPARIN SOD (PORK) LOCK FLUSH 100 UNIT/ML IV SOLN
500.0000 [IU] | Freq: Once | INTRAVENOUS | Status: DC
  Filled 2016-12-23: qty 5

## 2016-12-23 MED ORDER — HYDROCODONE-ACETAMINOPHEN 5-325 MG PO TABS: 1.0000 | ORAL_TABLET | ORAL | 0 refills | Status: DC | PRN

## 2016-12-23 NOTE — Telephone Encounter (Signed)
Message from pt's daughter reporting pt is having increased back pain. Has increased Hydrocodone to 1 full tab Q4 hours. She is moaning in pain.  Returned call, daughter reports she has called EMS, plans to take pt to ED for evaluation. Will make MD aware.

## 2016-12-23 NOTE — ED Provider Notes (Signed)
Emergency Department Provider Note   I have reviewed the triage vital signs and the nursing notes.   HISTORY  Chief Complaint Back Pain   HPI Crystal Small is a 82 y.o. female with PMH of active pancreatic cancer, GERD, HLD presents to the emergency department for evaluation of acute on chronic lower back pain. Patient has had symptoms for the past 12 weeks after falling against the wall. She has a history of compression fracture in the spine. Patient states she's got known pancreatic cancer with metastasis to the liver. She was supposed to get her chemotherapy on the 24th but could not because of fever at that time. She denies any fever in the last several days. No flulike symptoms. No anterior abdominal pain. She denies numbness or weakness in the lower extremities. No groin numbness. She does have some urinary urgency but no retention or bowel incontinence. She's been taking herhydrocodone at home which improves her pain for a short period of time but then it returns. They report that a CT was ordered by their Oncologist but they don't think that she can wait that Criston Chancellor due to pain.   Past Medical History:  Diagnosis Date  . GERD (gastroesophageal reflux disease)   . Hemorrhoids   . Hyperlipidemia   . pancreatic ca dx'd 05/2016  . Pancreatic mass     Patient Active Problem List   Diagnosis Date Noted  . Low back pain 11/21/2016  . Protein-calorie malnutrition, severe 09/30/2016  . Weakness generalized 09/28/2016  . Port catheter in place 07/25/2016  . Cancer of pancreas, body ([place]) 07/02/2016    Past Surgical History:  Procedure Laterality Date  . ABDOMINAL HYSTERECTOMY  1990   COMPLETE  . COLONOSCOPY  2006  . EUS N/A 06/27/2016   Procedure: ESOPHAGEAL ENDOSCOPIC ULTRASOUND (EUS) RADIAL;  Surgeon: Milus Banister, MD;  Location: [facility];  Service: Endoscopy;  Laterality: N/A;  . IR GENERIC HISTORICAL  07/10/2016   IR US GUIDE VASC ACCESS RIGHT 07/10/2016 Jacqulynn Cadet, MD [facility]  . IR GENERIC HISTORICAL  07/10/2016   IR FLUORO GUIDE CV LINE RIGHT 07/10/2016 Jacqulynn Cadet, MD [facility]  . LIVER BIOPSY  06/19/2016   WITH ULTRASOUND  . PORTACATH PLACEMENT      Current Outpatient Rx  . Order #: [id number] Class: Historical Med  . Order #: [id number] Class: Phone In  . Order #: [id number] Class: Historical Med  . Order #: [id number] Class: Print  . Order #: [id number] Class: Normal  . Order #: [id number] Class: Normal  . Order #: [id number] Class: Normal  . Order #: [id number] Class: Normal  . Order #: [id number] Class: Normal  . Order #: [id number] Class: Print  . Order #: [id number] Class: Normal    Allergies Patient has no known allergies.  Family History  Problem Relation Age of Onset  . Heart disease Sister   . Heart disease Brother   . Lung cancer Brother     Social History Social History  Substance Use Topics  . Smoking status: Never Smoker  . Smokeless tobacco: Never Used  . Alcohol use No    Review of Systems  Constitutional: Positive fever several days ago. Eyes: No visual changes. ENT: No sore throat. Cardiovascular: Denies chest pain. Respiratory: Denies shortness of breath. Gastrointestinal: No abdominal pain.  No nausea, no vomiting.  No diarrhea.  No constipation. Genitourinary: Negative for dysuria. Musculoskeletal: Positive for back pain. Skin: Negative for rash. Neurological: Negative for headaches, focal weakness or numbness.  10-point ROS otherwise negative.  ____________________________________________  PHYSICAL EXAM:  VITAL SIGNS: ED Triage Vitals  Enc Vitals Group     BP 12/23/16 1026 (!) 114/51     Pulse Rate 12/23/16 1026 78     Resp 12/23/16 1026 17     Temp 12/23/16 1026 97.6 F (36.4 C)     Temp Source 12/23/16 1026 Oral     SpO2 12/23/16 1026 96 %     Pain Score 12/23/16 1025 5   Constitutional: Alert and oriented. Very thin and frail-appearing.  Eyes: Conjunctivae are normal.    Head: Atraumatic. Nose: No congestion/rhinnorhea. Mouth/Throat: Mucous membranes are moist.  Oropharynx non-erythematous. Neck: No stridor.  Cardiovascular: Normal rate, regular rhythm. Good peripheral circulation. Grossly normal heart sounds.   Respiratory: Normal respiratory effort.  No retractions. Lungs CTAB. Gastrointestinal: Soft and nontender. No distention.  Musculoskeletal: No lower extremity tenderness nor edema. No gross deformities of extremities. Mild lower lumbar spine tenderness to palpation.  Neurologic:  Normal speech and language. No gross focal neurologic deficits are appreciated. Normal gait in the ED.  Skin:  Skin is warm, dry and intact. No rash noted. Psychiatric: Mood and affect are normal. Speech and behavior are normal.  ____________________________________________   LABS (all labs ordered are listed, but only abnormal results are displayed)  Labs Reviewed  COMPREHENSIVE METABOLIC PANEL - Abnormal; Notable for the following:       Result Value   Sodium 133 (*)    Potassium 3.3 (*)    Chloride 96 (*)    Glucose, Bld 108 (*)    Total Protein 6.2 (*)    Albumin 3.1 (*)    All other components within normal limits  CBC WITH DIFFERENTIAL/PLATELET - Abnormal; Notable for the following:    WBC 22.5 (*)    RBC 3.09 (*)    Hemoglobin 10.2 (*)    HCT 30.6 (*)    Neutro Abs 19.1 (*)    Monocytes Absolute 2.2 (*)    All other components within normal limits  URINALYSIS, ROUTINE W REFLEX MICROSCOPIC - Abnormal; Notable for the following:    Specific Gravity, Urine 1.004 (*)    All other components within normal limits  LIPASE, BLOOD   ____________________________________________  RADIOLOGY  Ct Abdomen Pelvis W Contrast  Result Date: 12/23/2016 CLINICAL DATA:  Pancreatic carcinoma diagnosed gene 2017. Chemotherapy in progress. Worsening abdominal pain. EXAM: CT ABDOMEN AND PELVIS WITH CONTRAST TECHNIQUE: Multidetector CT imaging of the abdomen and pelvis  was performed using the standard protocol following bolus administration of intravenous contrast. CONTRAST:  1 ISOVUE-300 IOPAMIDOL (ISOVUE-300) INJECTION 61%, 59mL ISOVUE-300 IOPAMIDOL (ISOVUE-300) INJECTION 61% COMPARISON:  CT 09/12/2016 FINDINGS: Lower chest: Lung bases are clear. Hepatobiliary: Increase in size of LEFT hepatic lobe lesions. Central LEFT hepatic lobe lesion measures 3.5 x 4. 5 cm (image 11, series 4) increased from 2.0 x 2.0 cm. This central LEFT hepatic lobe lesion results in new obstruction of the LEFT hepatic lobe intrahepatic bile ducts with dilatation in segments 2 and 3 (image 11, series 4). Lesion in the lateral LEFT hepatic lobe measures 4.7 cm increased from 2.2 cm. Two lesions in the posterior RIGHT hepatic lobe have decreased in size. For example segment 7 lesion measuring 1.7 cm (image 10, series 4) decreased from 3.7 cm. Pancreas: Increase in size of lesion in the mid pancreas measuring 2.9 x 3.2 cm (image 70, series 4) increased from 2.2 x 2. 6 cm. Dense at site of larger pancreatic lesion on CT of 06/14/2016. No duct dilatation of the  pancreatic duct. No dilatation of the common bile duct. Spleen: Irregular enhancement through the spleen is similar prior. Adrenals/urinary tract: Adrenal glands and kidneys are normal. The ureters and bladder normal. Stomach/Bowel: Stomach, small bowel, appendix, and cecum are normal. Moderate volume stool in the ascending and transverse colon. No distal LEFT LEFT colon rectum. No evidence obstruction. Vascular/Lymphatic: Abdominal aorta is normal caliber with atherosclerotic calcification. There is no retroperitoneal or periportal lymphadenopathy. No pelvic lymphadenopathy. RIGHT veins are patent. The splenic vein is occluded similar to prior. Reproductive: Post hysterectomy Other: No evidence of nodular peritoneal metastasis or omental metastasis. Small amount free pelvis Musculoskeletal: No aggressive osseous lesion. IMPRESSION: 1. Interval  significant increase in size of LEFT hepatic lobe metastasis with new BILIARY OBSTRUCTION to the LEFT lateral hepatic lobe. 2. Lesions in the RIGHT hepatic lobe have decreased in size. 3. Interval increase in size of pancreatic mass. 4. Moderate volume stool RIGHT colon suggests constipation. Electronically Signed   By: Suzy Bouchard M.D.   On: 12/23/2016 13:47    ____________________________________________   PROCEDURES  Procedure(s) performed:   Procedures  None ____________________________________________   INITIAL IMPRESSION / ASSESSMENT AND PLAN / ED COURSE  Pertinent labs & imaging results that were available during my care of the patient were reviewed by me and considered in my medical decision making (see chart for details).  Patient resents to the emergency pertinent for evaluation of lower back pain. She has a history of pancreatic cancer with metastatic disease to the liver. No known spine involvement. Patient has no red flag symptoms to suggest severe spinal cord stenosis or impingement. Neurological exam the lower extremities is normal. Patient has mild point tenderness in her back. Given her history of pancreatic cancer I'm concerned for possible metastatic disease. Also considering intra-abdominal processes. We will obtain a CT scan of the abdomen and pelvis, as well as ordered by her oncologist, to evaluate both the spine and abdomen. Given morphine 4 acute pain in the ED.   02:00 PM Patient with evidence of enlarging pancreatic mass. She has some element of biliary obstruction but is not particular tender in the upper quadrant of the abdomen. No evidence of significant obstruction on lab evaluation. Will discuss the findings with Dr. Benay Spice.   Spoke with Dr. Benay Spice and the patient regarding the CT results. Dr. Benay Spice advises that the patient call the office and can be seen in the next 1-2 days to discuss the scan. No evidence of bony abnormality on the CT. No  evidence on my exam to suggest cauda equina or other spinal cord impingement to require MRI at this time. Gave additional Rx for pain medication and ill have pt follow up with Oncology this week.  ____________________________________________  FINAL CLINICAL IMPRESSION(S) / ED DIAGNOSES  Final diagnoses:  Chronic low back pain without sciatica, unspecified back pain laterality     MEDICATIONS GIVEN DURING THIS VISIT:  Medications  morphine 2 MG/ML injection 2 mg (2 mg Intravenous Given 12/23/16 1135)  iopamidol (ISOVUE-300) 61 % injection 30 mL (30 mLs Oral Contrast Given 12/23/16 1111)     NEW OUTPATIENT MEDICATIONS STARTED DURING THIS VISIT:  Discharge Medication List as of 12/23/2016  2:43 PM    START taking these medications   Details  !! HYDROcodone-acetaminophen (NORCO/VICODIN) 5-325 MG tablet Take 1 tablet by mouth every 4 (four) hours as needed., Starting Mon 12/23/2016, Until Sat 12/28/2016, Print     !! - Potential duplicate medications found. Please discuss with provider.  Note:  This document was prepared using Dragon voice recognition software and may include unintentional dictation errors.  Nanda Quinton, MD Emergency Medicine   Margette Fast, MD 12/24/16 [phone]

## 2016-12-23 NOTE — Discharge Instructions (Signed)
You have been seen in the Emergency Department (ED)  today for back pain.  Your workup and exam have not shown any acute abnormalities and you are likely suffering from muscle strain or possible problems with your discs, but there is no treatment that will fix your symptoms at this time.  Please take Motrin (ibuprofen) as needed for your pain according to the instructions written on the box.  Alternatively, for the next five days you can take 600mg  three times daily with meals (it may upset your stomach).  Take Hydrocodone as prescribed for severe pain. Do not drink alcohol, drive or participate in any other potentially dangerous activities while taking this medication as it may make you sleepy. Do not take this medication with any other sedating medications, either prescription or over-the-counter. If you were prescribed Percocet or Vicodin, do not take these with acetaminophen (Tylenol) as it is already contained within these medications.   This medication is an opiate (or narcotic) pain medication and can be habit forming.  Use it as little as possible to achieve adequate pain control.  Do not use or use it with extreme caution if you have a history of opiate abuse or dependence.  If you are on a pain contract with your primary care doctor or a pain specialist, be sure to let them know you were prescribed this medication today from the [facility] Emergency Department.  This medication is intended for your use only - do not give any to anyone else and keep it in a secure place where nobody else, especially children, have access to it.  It will also cause or worsen constipation, so you may want to consider taking an over-the-counter stool softener while you are taking this medication.  Please follow up with your doctor as soon as possible regarding today's ED visit and your back pain.  Return to the ED for worsening back pain, fever, weakness or numbness of either leg, or if you develop either (1) an  inability to urinate or have bowel movements, or (2) loss of your ability to control your bathroom functions (if you start having "accidents"), or if you develop other new symptoms that concern you.

## 2016-12-23 NOTE — Telephone Encounter (Signed)
lvm to inform pt of 1/30 appt with LT at 1145 per LOS

## 2016-12-23 NOTE — ED Triage Notes (Signed)
Patient brought in by EMS from home for back pain that started last night but has been re-occurring so patient's daughter wants her to be seen to figure what is causing the pain. Patient took hydrocodone earlier at the house for the pain.   Patient getting treated for pancreatic cancer. Vital 110/78, 84HR, 16R, 98%room air.

## 2016-12-24 ENCOUNTER — Ambulatory Visit: Payer: Medicare Other | Admitting: Nurse Practitioner

## 2016-12-25 ENCOUNTER — Other Ambulatory Visit: Payer: Self-pay | Admitting: *Deleted

## 2016-12-25 ENCOUNTER — Ambulatory Visit (HOSPITAL_BASED_OUTPATIENT_CLINIC_OR_DEPARTMENT_OTHER): Payer: Medicare Other | Admitting: Oncology

## 2016-12-25 VITALS — BP 104/47 | HR 79 | Temp 98.1°F | Resp 18 | Ht 65.0 in | Wt 97.0 lb

## 2016-12-25 DIAGNOSIS — C251 Malignant neoplasm of body of pancreas: Secondary | ICD-10-CM

## 2016-12-25 DIAGNOSIS — B370 Candidal stomatitis: Secondary | ICD-10-CM

## 2016-12-25 DIAGNOSIS — R634 Abnormal weight loss: Secondary | ICD-10-CM

## 2016-12-25 DIAGNOSIS — C787 Secondary malignant neoplasm of liver and intrahepatic bile duct: Secondary | ICD-10-CM | POA: Diagnosis not present

## 2016-12-25 DIAGNOSIS — G893 Neoplasm related pain (acute) (chronic): Secondary | ICD-10-CM

## 2016-12-25 DIAGNOSIS — G620 Drug-induced polyneuropathy: Secondary | ICD-10-CM

## 2016-12-25 DIAGNOSIS — C258 Malignant neoplasm of overlapping sites of pancreas: Secondary | ICD-10-CM | POA: Diagnosis not present

## 2016-12-25 DIAGNOSIS — R630 Anorexia: Secondary | ICD-10-CM

## 2016-12-25 MED ORDER — TRAMADOL HCL 50 MG PO TABS: ORAL_TABLET | ORAL | 0 refills | Status: DC

## 2016-12-25 MED ORDER — POTASSIUM CHLORIDE 20 MEQ/15ML (10%) PO SOLN: ORAL | 0 refills | Status: AC

## 2016-12-25 MED ORDER — TRAMADOL HCL 50 MG PO TABS: 50.0000 mg | ORAL_TABLET | Freq: Four times a day (QID) | ORAL | 0 refills | Status: DC | PRN

## 2016-12-25 NOTE — Progress Notes (Signed)
[facility] referral called in per order of Dr. Benay Spice.

## 2016-12-25 NOTE — Progress Notes (Signed)
[place] OFFICE PROGRESS NOTE   Diagnosis: Pancreas cancer  INTERVAL HISTORY:   Crystal Small was evaluated in the emergency room 12/23/2016 with increased back pain. A CT of the abdomen and pelvis revealed enlargement of a left hepatic lobe lesion with new obstruction of the left intrahepatic bile ducts. Another lesion in the left hepatic lobe has increased in size from 2.27 m to 4.7 cm. 2 lesions in the posterior right liver have decreased in size. The pancreas body mass has increased in size. No retroperitoneal or periportal lymphadenopathy. No bone lesions.  She continues to have pain in the mid abdomen and lower back.she becomes somnolent after taking hydrocodone.She has been taking hydrocodone every 2 hours. Her appetite is poor.  Objective:  Vital signs in last 24 hours:  Blood pressure (!) 104/47, pulse 79, temperature 98.1 F (36.7 C), temperature source Oral, resp. rate 18, height 5\' 5"  (1.651 m), weight 97 lb (44 kg), SpO2 100 %.    HEENT: thrush at the tongue and buccal mucosa Resp: lungs clear bilaterally Cardio: regular rate and rhythm GI: no hepatomegaly, no mass Vascular: no leg edema   Portacath/PICC-without erythema  Lab Results:  Lab Results  Component Value Date   WBC 22.5 (H) 12/23/2016   HGB 10.2 (L) 12/23/2016   HCT 30.6 (L) 12/23/2016   MCV 99.0 12/23/2016   PLT 174 12/23/2016   NEUTROABS 19.1 (H) 12/23/2016      Imaging:  Ct Abdomen Pelvis W Contrast  Result Date: 12/23/2016 CLINICAL DATA:  Pancreatic carcinoma diagnosed gene 2017. Chemotherapy in progress. Worsening abdominal pain. EXAM: CT ABDOMEN AND PELVIS WITH CONTRAST TECHNIQUE: Multidetector CT imaging of the abdomen and pelvis was performed using the standard protocol following bolus administration of intravenous contrast. CONTRAST:  1 ISOVUE-300 IOPAMIDOL (ISOVUE-300) INJECTION 61%, 31mL ISOVUE-300 IOPAMIDOL (ISOVUE-300) INJECTION 61% COMPARISON:  CT 09/12/2016 FINDINGS:  Lower chest: Lung bases are clear. Hepatobiliary: Increase in size of LEFT hepatic lobe lesions. Central LEFT hepatic lobe lesion measures 3.5 x 4. 5 cm (image 11, series 4) increased from 2.0 x 2.0 cm. This central LEFT hepatic lobe lesion results in new obstruction of the LEFT hepatic lobe intrahepatic bile ducts with dilatation in segments 2 and 3 (image 11, series 4). Lesion in the lateral LEFT hepatic lobe measures 4.7 cm increased from 2.2 cm. Two lesions in the posterior RIGHT hepatic lobe have decreased in size. For example segment 7 lesion measuring 1.7 cm (image 10, series 4) decreased from 3.7 cm. Pancreas: Increase in size of lesion in the mid pancreas measuring 2.9 x 3.2 cm (image 70, series 4) increased from 2.2 x 2. 6 cm. Dense at site of larger pancreatic lesion on CT of 06/14/2016. No duct dilatation of the pancreatic duct. No dilatation of the common bile duct. Spleen: Irregular enhancement through the spleen is similar prior. Adrenals/urinary tract: Adrenal glands and kidneys are normal. The ureters and bladder normal. Stomach/Bowel: Stomach, small bowel, appendix, and cecum are normal. Moderate volume stool in the ascending and transverse colon. No distal LEFT LEFT colon rectum. No evidence obstruction. Vascular/Lymphatic: Abdominal aorta is normal caliber with atherosclerotic calcification. There is no retroperitoneal or periportal lymphadenopathy. No pelvic lymphadenopathy. RIGHT veins are patent. The splenic vein is occluded similar to prior. Reproductive: Post hysterectomy Other: No evidence of nodular peritoneal metastasis or omental metastasis. Small amount free pelvis Musculoskeletal: No aggressive osseous lesion. IMPRESSION: 1. Interval significant increase in size of LEFT hepatic lobe metastasis with new BILIARY OBSTRUCTION to the  LEFT lateral hepatic lobe. 2. Lesions in the RIGHT hepatic lobe have decreased in size. 3. Interval increase in size of pancreatic mass. 4. Moderate volume  stool RIGHT colon suggests constipation. Electronically Signed   By: Suzy Bouchard M.D.   On: 12/23/2016 13:47    Medications: I have reviewed the patient's current medications.  Assessment/Plan: 1. 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 Primary lesion involving the tail of the pancreas decreased in size.  Cycle 1 FOLFOX 09/26/2016  Cycle 2 FOLFOX 10/10/2016  Cycle 3 FOLFOX 10/31/2016 (scheduled adjustedto every 3 weeks per patient request)  Cycle 4 FOLFOX 11/21/2016  Cycle 5 FOLFOX 12/18/2016  CT abdomen/pelvis  12/23/2016-increased left hepatic metastasis with intrahepatic biliary  Obstruction, increased pancreas mass, decreased right liver lesions  2. abdominal pain secondary to #1- increased  3.  Anorexia/weight loss  4. Status post evaluation in the emergency department 07/13/2016 for fever and confusion. Urine culture showed enterococcus. She completed a course of amoxicillin.  5. history of Hypercalcemia, status post Zometa 09/26/2016  6. Hospitalization 09/28/2016 through 10/01/2015 presenting with weakness and fever. Diagnosed with UTI.  7. Low-grade fevers, question "tumor fever". Denies fever   8.   Early oxaliplatin neuropathy   Disposition:  Ms. Kremin has clinical evidence of disease progression  with anorexia, weight loss, and increased pain. The restaging CT reveals a mixed response, but the overall pattern is of disease progression. I recommend comfort care. She agrees to a [facility] referral. We discussed CPR and ACLS issues. She will be placed on a no CODE BLUE status.  She will try tramadol for pain. The hospice team can assess  Her for the need of a long acting narcotic. She will complete the previously prescribed course of Diflucan for oral candidiasis.  Ms. Lotspeich will return for an office visit in 3 weeks.    [name], MD  12/25/2016  7:33 PM

## 2016-12-30 ENCOUNTER — Telehealth: Payer: Self-pay | Admitting: Oncology

## 2016-12-30 ENCOUNTER — Telehealth: Payer: Self-pay | Admitting: *Deleted

## 2016-12-30 MED ORDER — TRAMADOL HCL 50 MG PO TABS: ORAL_TABLET | ORAL | 0 refills | Status: AC

## 2016-12-30 NOTE — Telephone Encounter (Signed)
Message from hospice RN reporting pt needs refill of Tramadol. Script called to pharmacy per Dr. Benay Spice.

## 2016-12-30 NOTE — Telephone Encounter (Signed)
I spoke with Crystal Small, patients daughter and she said that [name] was not able to transport to doctor appointments and is now with Hospice

## 2017-01-08 ENCOUNTER — Telehealth: Payer: Self-pay | Admitting: Oncology

## 2017-01-08 NOTE — Telephone Encounter (Signed)
[name] called from Hospice to let us know that Crystal Small died 2017/02/01 at 9:48pm at home

## 2017-01-08 NOTE — Telephone Encounter (Signed)
Message sent to schedulers about patient's passing and Dr. Benay Spice notified.

## 2017-01-15 ENCOUNTER — Other Ambulatory Visit: Payer: Medicare Other

## 2017-01-15 ENCOUNTER — Ambulatory Visit: Payer: Medicare Other | Admitting: Nurse Practitioner

## 2017-01-23 DEATH — deceased

## 2017-03-10 ENCOUNTER — Other Ambulatory Visit: Payer: Self-pay | Admitting: Nurse Practitioner

## 2017-03-30 IMAGING — CR DG CHEST 2V
2 series · 2 of 2 positions shown · non-contrast
Comparison: 07/10/2016

CLINICAL DATA: Confusion, history of pancreatic carcinoma with
chemotherapy treatment

EXAM:
CHEST  2 VIEW

[w chest pa]
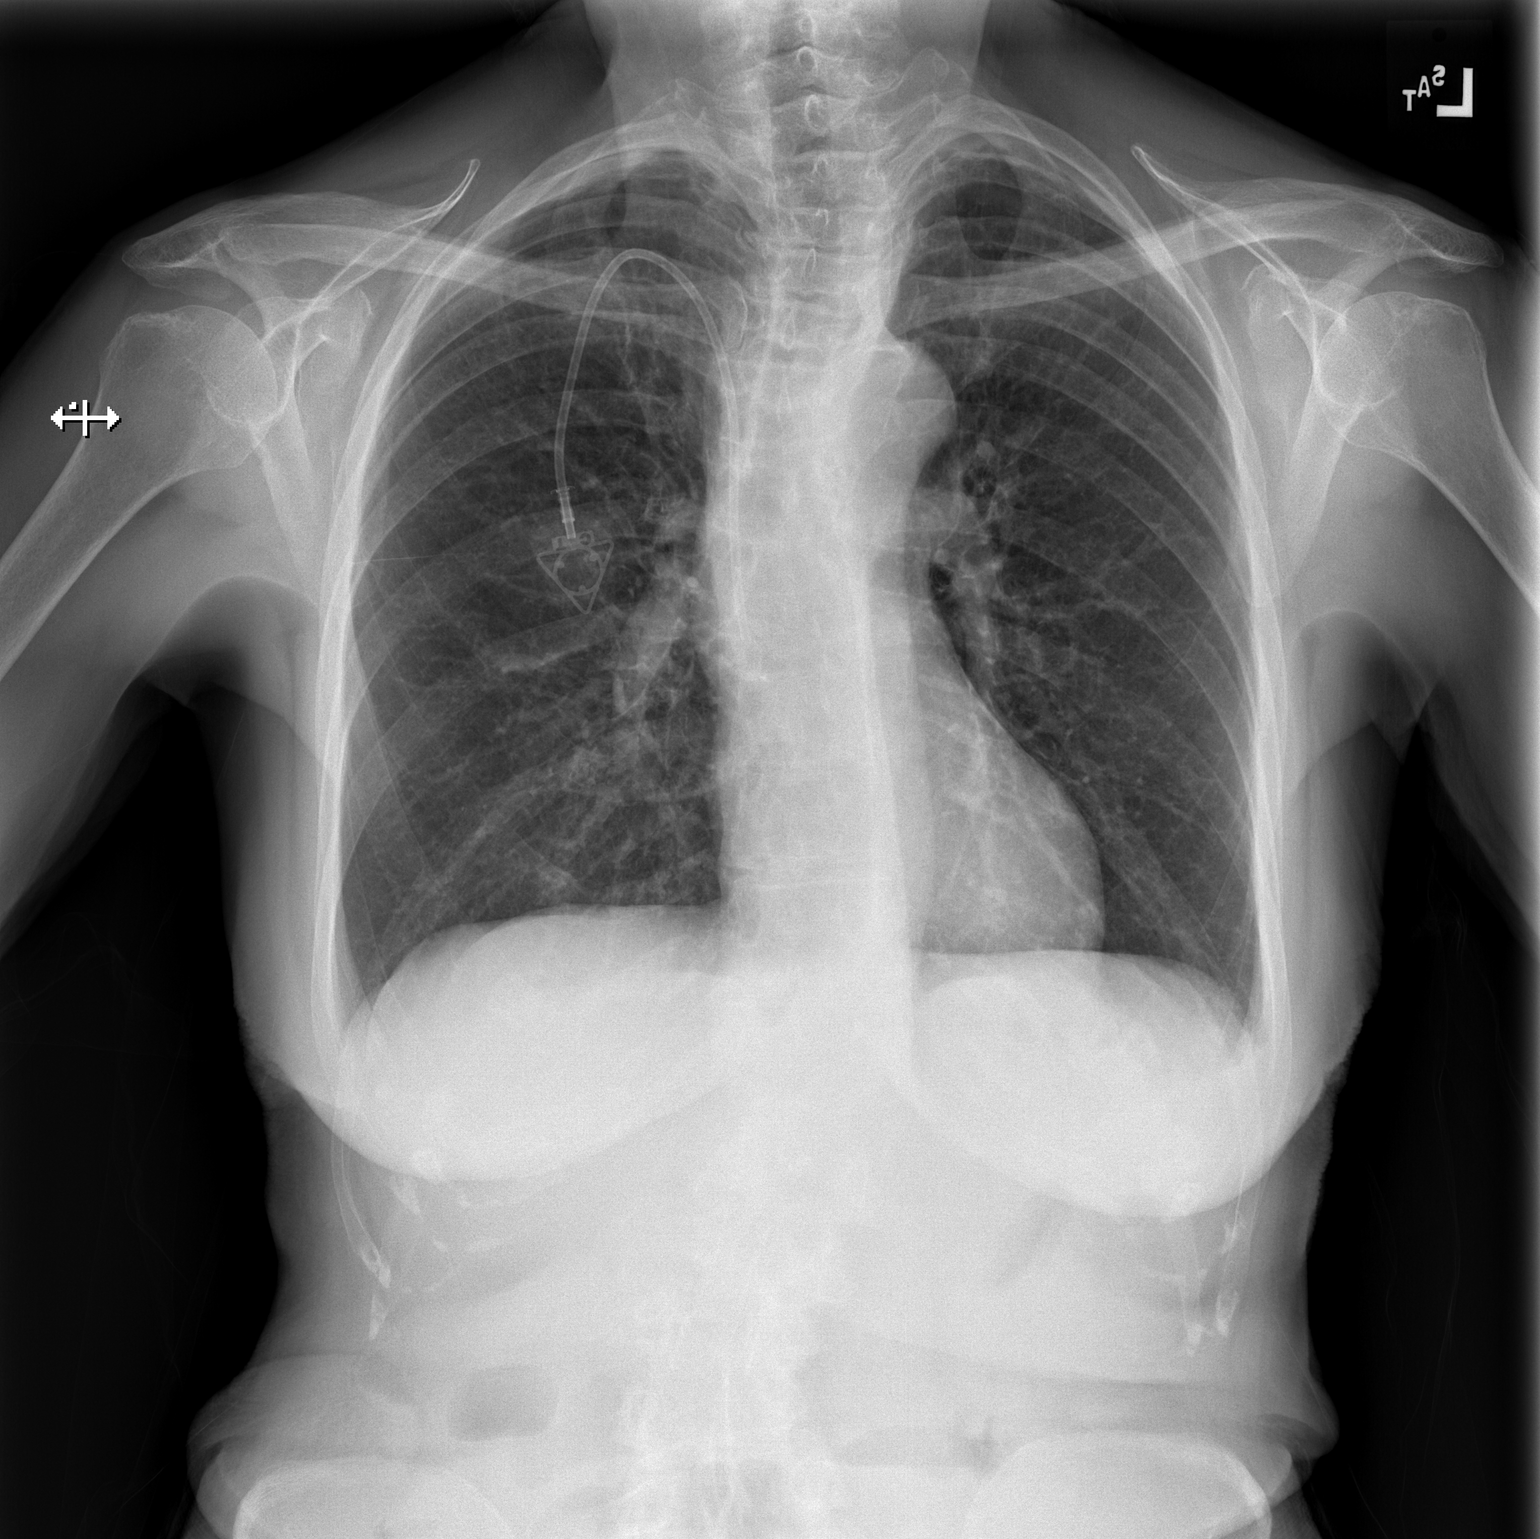

[w chest lat]
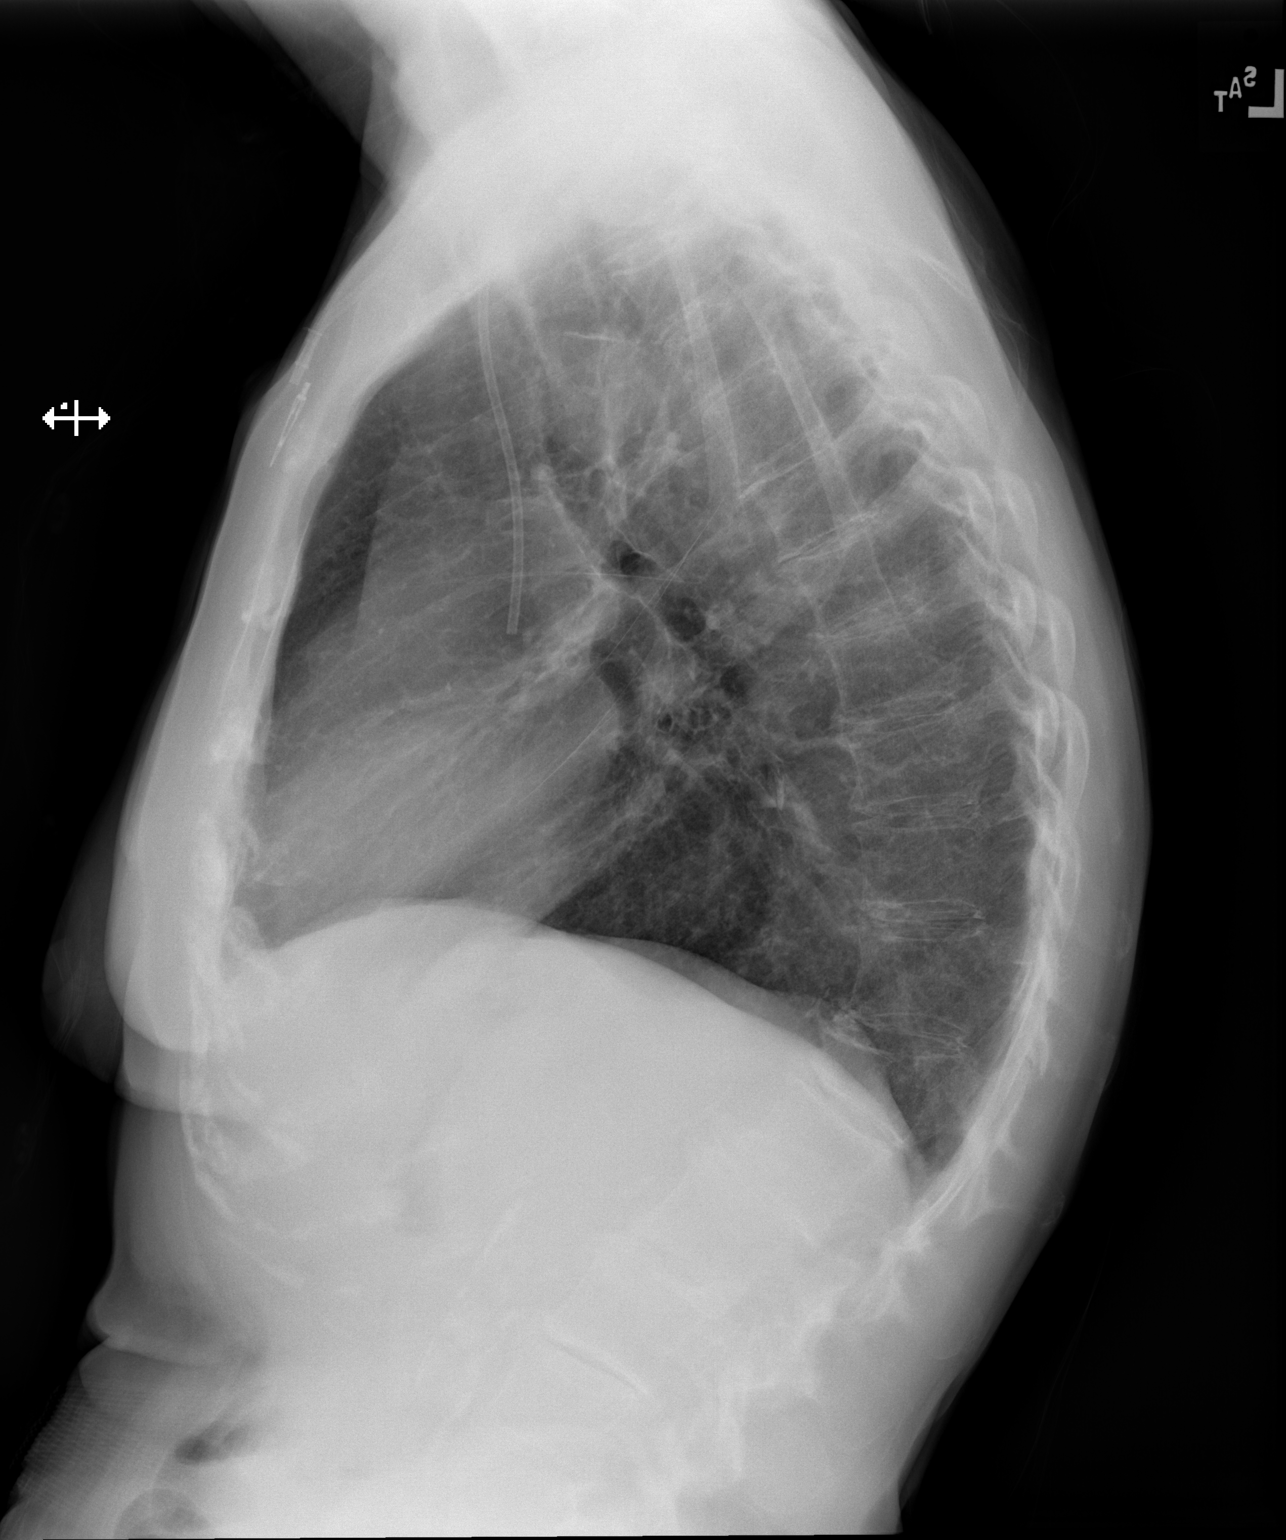

[2 of 2 positions shown; findings below may reference images not displayed]

FINDINGS: Right chest wall port is again seen and stable. Cardiac shadow is
within normal limits. The lungs are well aerated without focal
infiltrate or sizable effusion. Stable compression deformity of T12
is noted. No acute bony abnormality is seen.
IMPRESSION: No active cardiopulmonary disease.

## 2017-05-30 IMAGING — CT CT ABD-PELV W/ CM
2 of 9 series · 13 of 46 positions shown, 18 images · IV contrast (iopamidol)
Comparison: 06/14/2016

CLINICAL DATA: Followup pancreas cancer

EXAM:
CT ABDOMEN AND PELVIS WITH CONTRAST
TECHNIQUE: Multidetector CT imaging of the abdomen and pelvis was performed
using the standard protocol following bolus administration of
intravenous contrast.
CONTRAST:  100mL RQOWSW-P33 IOPAMIDOL (RQOWSW-P33) INJECTION 61%

[Series 6: venous thins pacs · axial · portal-venous · 0.63mm/px · z∈[-356,-38]mm · 10 of 128 slices shown, 15 images]
[im 11/128  soft-tissue]
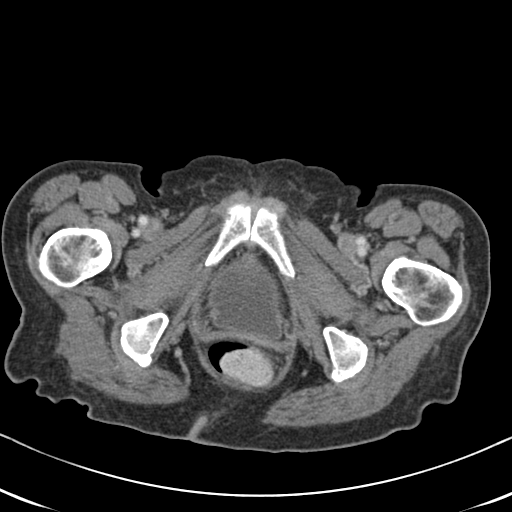
[im 11/128  bone]
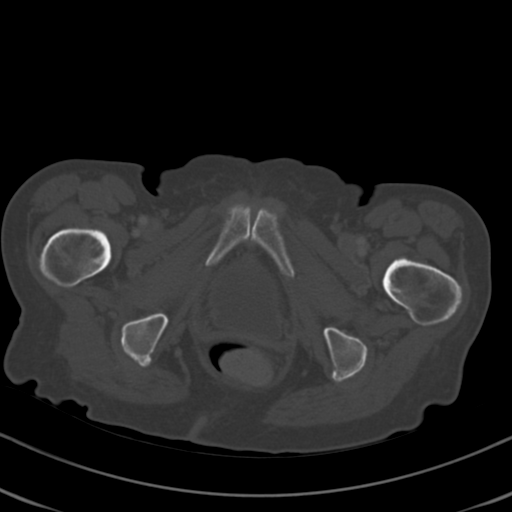
[im 22/128  soft-tissue]
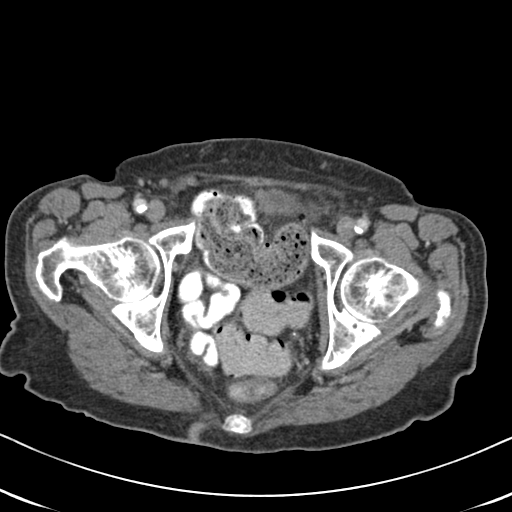
[im 43/128  soft-tissue]
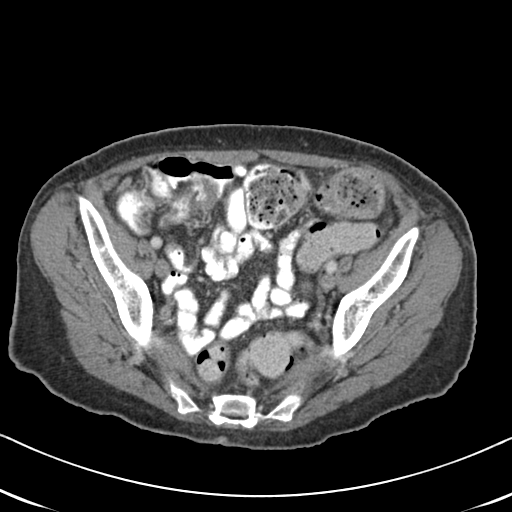
[im 53/128  soft-tissue]
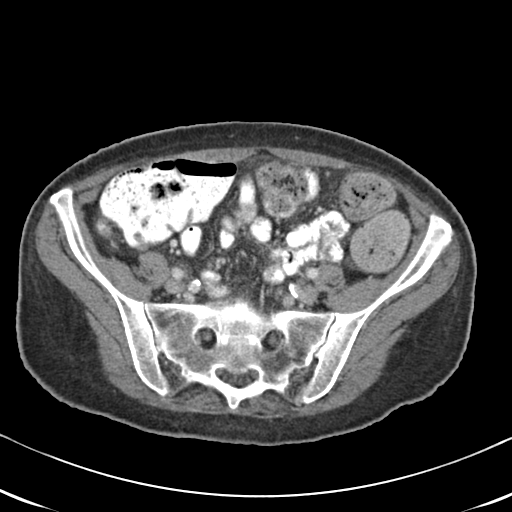
[im 64/128  soft-tissue]
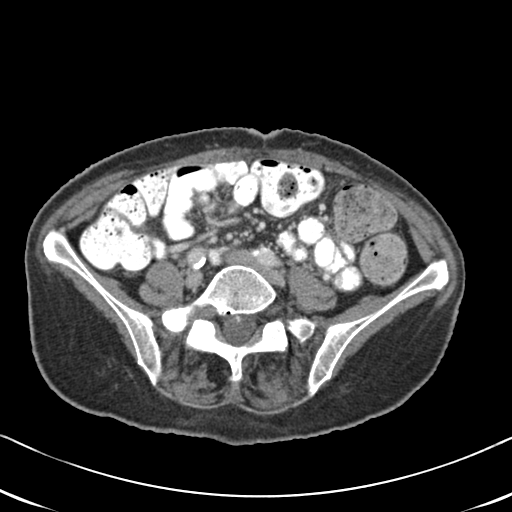
[im 75/128  soft-tissue]
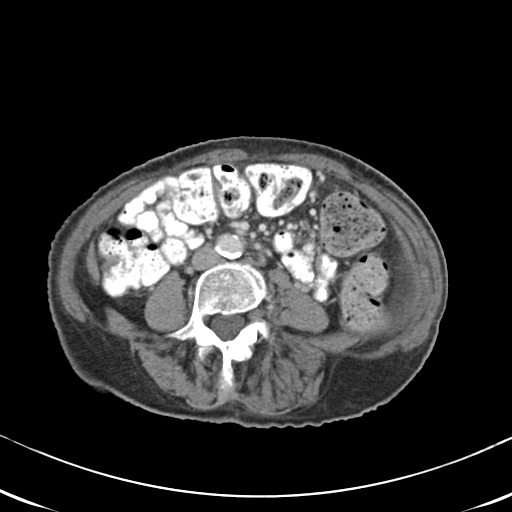
[im 85/128  soft-tissue]
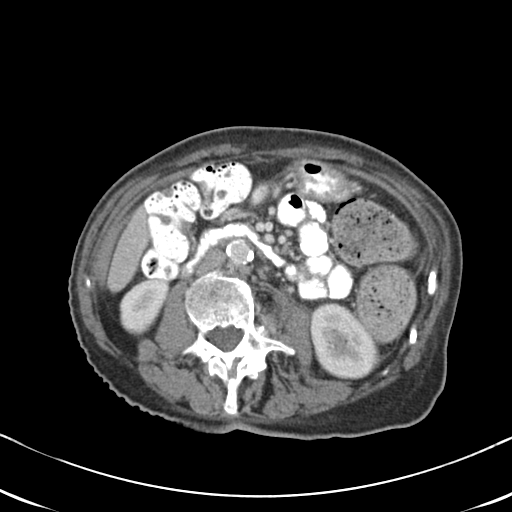
[im 85/128  lung]
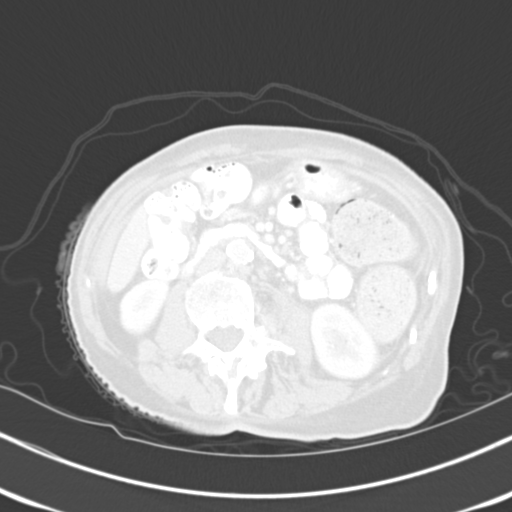
[im 96/128  lung]
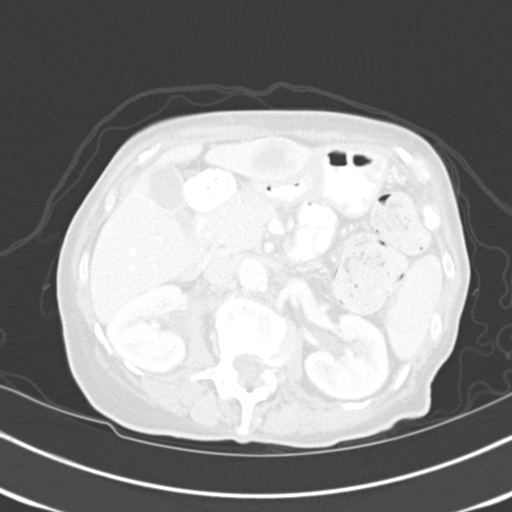
[im 106/128  soft-tissue]
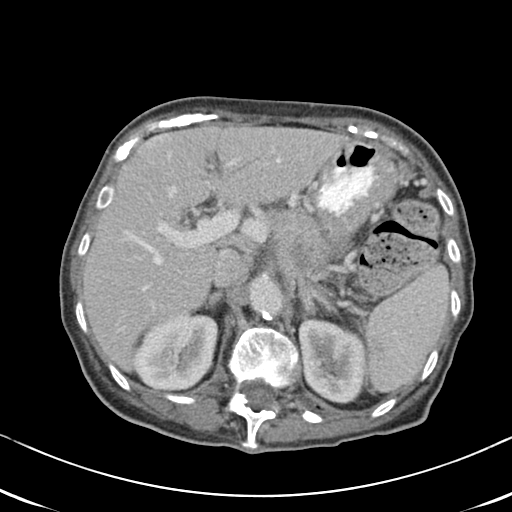
[im 106/128  lung]
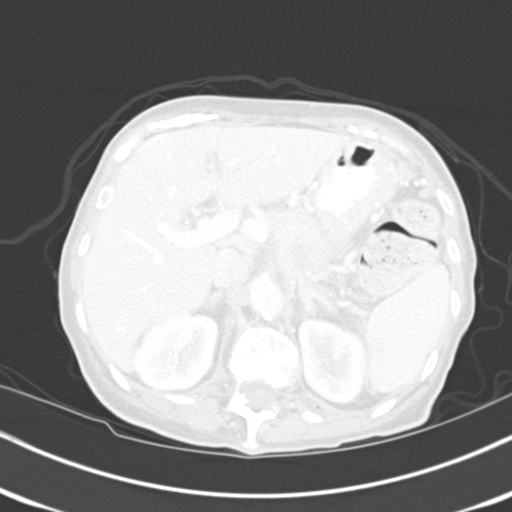
[im 117/128  soft-tissue]
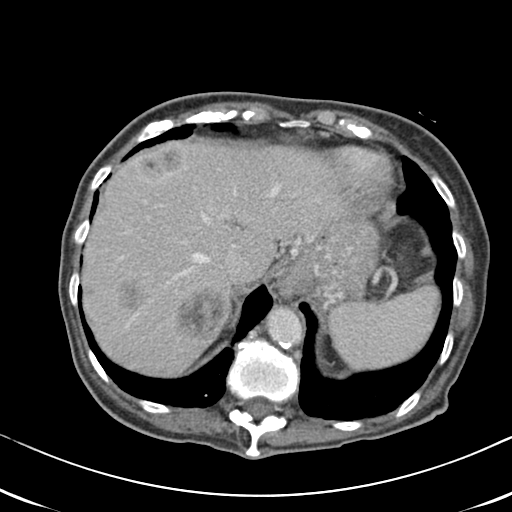
[im 117/128  lung]
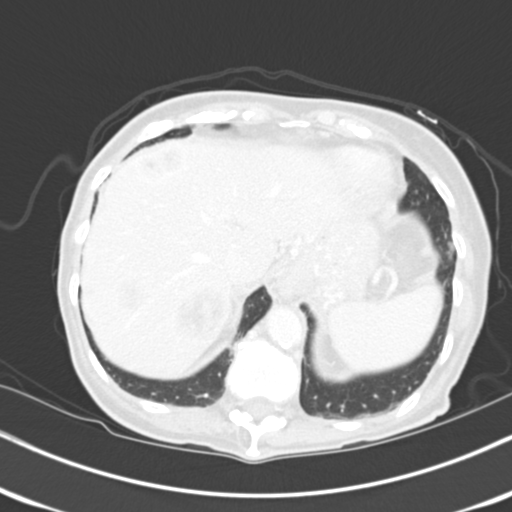
[im 117/128  bone]
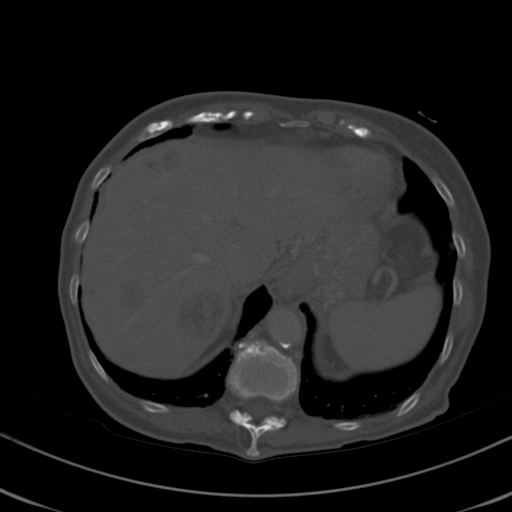

[Series 602: <mpr thick range> · coronal · 0.63mm/px · 3 of 110 slices shown]
[im 28/110  soft-tissue]
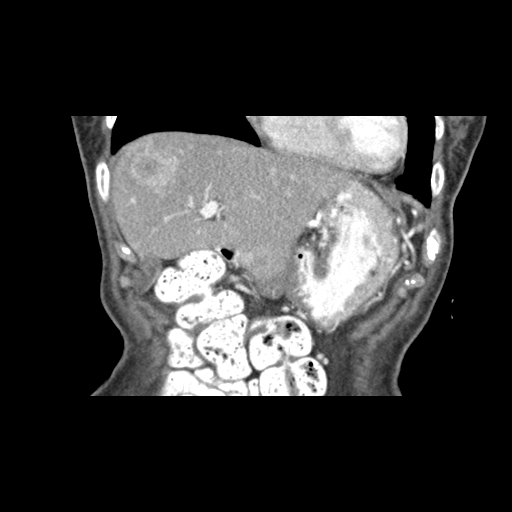
[im 55/110  soft-tissue]
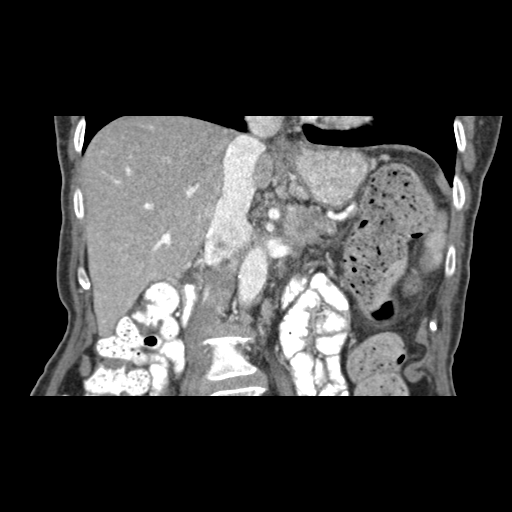
[im 82/110  soft-tissue]
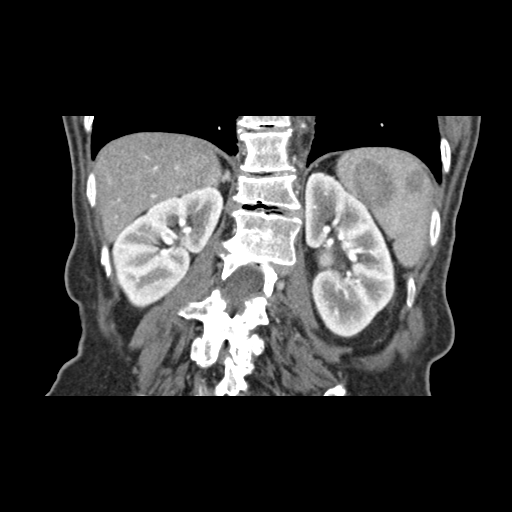

[13 of 46 positions shown; findings below may reference images not displayed]

FINDINGS: Lower chest: No pleural effusion.  Lung bases are clear.

Hepatobiliary: Gallbladder is normal. No biliary dilatation.
Multifocal liver metastases are identified. Index lesion within the
posterior medial right lobe measures 4.4 x 3.6 cm, image 12 of
series 2. Previously 3.8 x 3.0 cm. Anterior right lobe of liver
lesion measures 3.1 x 1.9 cm, image 13 of series 2. Previously 2.1 x
2.0 cm. New lesion within the lateral segment of left lobe measures
2.2 x 2.0 cm, image 18 of series 2. Enlarging liver lesion within
the lateral segment of left lobe measures 3.3 x 2.6 cm, image 32 of
series 2. Previously 1.1 x 0.9 cm. Gallbladder is normal. No biliary
dilatation.

Pancreas: Mass involving the tail of pancreas measures 3.3 x 3.4 cm,
image 25 of series 2. Previously 4.5 x 5.7 cm. Chronic occlusion of
the splenic artery and splenic vein noted.

Spleen: There are several areas of low-attenuation which may reflect
perfusion anomaly secondary to occlusion of the splenic artery and
splenic vein. This was not seen on the previous exam however.
Splenic infarcts as well as splenic metastasis could conceivably
have a similar appearance.

Adrenals/Urinary Tract: The adrenal glands are normal. Normal
appearance of the kidneys. Urinary bladder is unremarkable.

Stomach/Bowel: Small hiatal hernia. The small bowel loops have a
normal course and caliber. Moderate stool burden identified within
the colon.

Vascular/Lymphatic: Aortic atherosclerosis. Gastrohepatic ligament
lymph node measures 9 mm, image 11 of series 4. Unchanged from
previous exam.

Reproductive: Status post hysterectomy. No adnexal masses.

Other: No ascites or focal fluid collections. No peritoneal
metastases identified.

Musculoskeletal: Degenerative disc disease noted within the lumbar
spine. There is a curvature the lumbar spine which is convex towards
the right.
IMPRESSION: 1. Mixed interval response to therapy.
2. Significant progression of liver metastases.
3. Primary lesion involving the tail of pancreas is decreased in
size from previous exam.
